# Patient Record
Sex: Female | Born: 1945 | Race: White | Hispanic: No | Marital: Single | Smoking: Current every day smoker
Health system: Southern US, Community
[De-identification: ages and names within clinical notes are randomized; demographics above are authoritative.]

## PROBLEM LIST (undated history)

## (undated) DIAGNOSIS — IMO0002 Reserved for concepts with insufficient information to code with codable children: Secondary | ICD-10-CM

## (undated) DIAGNOSIS — C569 Malignant neoplasm of unspecified ovary: Secondary | ICD-10-CM

## (undated) DIAGNOSIS — C801 Malignant (primary) neoplasm, unspecified: Secondary | ICD-10-CM

## (undated) DIAGNOSIS — C259 Malignant neoplasm of pancreas, unspecified: Secondary | ICD-10-CM

## (undated) DIAGNOSIS — M1990 Unspecified osteoarthritis, unspecified site: Secondary | ICD-10-CM

## (undated) DIAGNOSIS — J449 Chronic obstructive pulmonary disease, unspecified: Secondary | ICD-10-CM

## (undated) DIAGNOSIS — M329 Systemic lupus erythematosus, unspecified: Secondary | ICD-10-CM

## (undated) DIAGNOSIS — B958 Unspecified staphylococcus as the cause of diseases classified elsewhere: Secondary | ICD-10-CM

## (undated) HISTORY — PX: KNEE SURGERY: SHX244

## (undated) HISTORY — PX: GASTRIC BYPASS: SHX52

## (undated) HISTORY — PX: HAND SURGERY: SHX662

## (undated) HISTORY — PX: TONSILLECTOMY: SUR1361

## (undated) HISTORY — DX: Unspecified osteoarthritis, unspecified site: M19.90

## (undated) HISTORY — PX: TUBAL LIGATION: SHX77

## (undated) HISTORY — DX: Malignant (primary) neoplasm, unspecified: C80.1

## (undated) HISTORY — PX: VENTRAL HERNIA REPAIR: SHX424

## (undated) HISTORY — DX: Reserved for concepts with insufficient information to code with codable children: IMO0002

## (undated) HISTORY — PX: PARTIAL HYSTERECTOMY: SHX80

## (undated) HISTORY — DX: Malignant neoplasm of unspecified ovary: C56.9

## (undated) HISTORY — DX: Unspecified staphylococcus as the cause of diseases classified elsewhere: B95.8

## (undated) HISTORY — PX: APPENDECTOMY: SHX54

## (undated) HISTORY — DX: Systemic lupus erythematosus, unspecified: M32.9

## (undated) SURGERY — Surgical Case
Anesthesia: *Unknown

---

## 2002-02-14 ENCOUNTER — Inpatient Hospital Stay (HOSPITAL_COMMUNITY): Admission: EM | Admit: 2002-02-14 | Discharge: 2002-02-27 | Payer: Self-pay | Admitting: Psychiatry

## 2005-02-19 ENCOUNTER — Emergency Department: Payer: Self-pay | Admitting: Emergency Medicine

## 2006-08-06 ENCOUNTER — Emergency Department: Payer: Self-pay | Admitting: Internal Medicine

## 2006-09-27 ENCOUNTER — Emergency Department: Payer: Self-pay | Admitting: Emergency Medicine

## 2006-12-12 ENCOUNTER — Emergency Department: Payer: Self-pay | Admitting: Emergency Medicine

## 2006-12-21 ENCOUNTER — Emergency Department: Payer: Self-pay | Admitting: Emergency Medicine

## 2007-04-17 ENCOUNTER — Emergency Department: Payer: Self-pay | Admitting: Emergency Medicine

## 2007-07-13 ENCOUNTER — Ambulatory Visit: Payer: Self-pay | Admitting: General Surgery

## 2008-08-26 ENCOUNTER — Ambulatory Visit: Payer: Self-pay | Admitting: Specialist

## 2008-09-04 ENCOUNTER — Ambulatory Visit: Payer: Self-pay | Admitting: Specialist

## 2009-05-09 DIAGNOSIS — C569 Malignant neoplasm of unspecified ovary: Secondary | ICD-10-CM

## 2009-05-09 HISTORY — DX: Malignant neoplasm of unspecified ovary: C56.9

## 2009-06-23 ENCOUNTER — Ambulatory Visit: Payer: Self-pay | Admitting: Family Medicine

## 2009-08-07 ENCOUNTER — Ambulatory Visit: Payer: Self-pay | Admitting: Oncology

## 2009-08-11 ENCOUNTER — Ambulatory Visit: Payer: Self-pay | Admitting: Family Medicine

## 2009-08-18 ENCOUNTER — Ambulatory Visit: Payer: Self-pay | Admitting: Family Medicine

## 2009-08-26 ENCOUNTER — Ambulatory Visit: Payer: Self-pay | Admitting: Oncology

## 2009-09-02 ENCOUNTER — Ambulatory Visit: Payer: Self-pay | Admitting: Gastroenterology

## 2009-09-06 ENCOUNTER — Ambulatory Visit: Payer: Self-pay | Admitting: Oncology

## 2009-11-06 ENCOUNTER — Ambulatory Visit: Payer: Self-pay | Admitting: Oncology

## 2009-12-01 ENCOUNTER — Ambulatory Visit: Payer: Self-pay | Admitting: Oncology

## 2009-12-03 ENCOUNTER — Ambulatory Visit: Payer: Self-pay | Admitting: Oncology

## 2009-12-04 LAB — CA 125: CA 125: 21.1 U/mL (ref 0.0–34.0)

## 2009-12-07 ENCOUNTER — Ambulatory Visit: Payer: Self-pay | Admitting: Oncology

## 2009-12-14 ENCOUNTER — Ambulatory Visit: Payer: Self-pay | Admitting: Oncology

## 2009-12-16 ENCOUNTER — Ambulatory Visit: Payer: Self-pay | Admitting: Oncology

## 2009-12-30 ENCOUNTER — Ambulatory Visit: Payer: Self-pay | Admitting: Gynecologic Oncology

## 2010-01-07 ENCOUNTER — Ambulatory Visit: Payer: Self-pay | Admitting: Oncology

## 2010-02-06 ENCOUNTER — Ambulatory Visit: Payer: Self-pay | Admitting: Oncology

## 2010-03-09 ENCOUNTER — Ambulatory Visit: Payer: Self-pay | Admitting: Oncology

## 2010-03-09 ENCOUNTER — Ambulatory Visit: Payer: Self-pay | Admitting: Gynecologic Oncology

## 2010-03-09 HISTORY — PX: LAPAROSCOPIC LYSIS INTESTINAL ADHESIONS: SUR778

## 2010-03-11 LAB — PATHOLOGY REPORT

## 2010-03-16 ENCOUNTER — Inpatient Hospital Stay: Payer: Self-pay | Admitting: General Surgery

## 2010-03-19 ENCOUNTER — Ambulatory Visit: Payer: Self-pay | Admitting: Oncology

## 2010-04-03 ENCOUNTER — Emergency Department: Payer: Self-pay | Admitting: Emergency Medicine

## 2010-04-04 ENCOUNTER — Emergency Department: Payer: Self-pay | Admitting: Emergency Medicine

## 2010-04-08 ENCOUNTER — Ambulatory Visit: Payer: Self-pay | Admitting: Oncology

## 2010-04-20 ENCOUNTER — Ambulatory Visit: Payer: Self-pay | Admitting: Oncology

## 2010-05-09 ENCOUNTER — Ambulatory Visit: Payer: Self-pay | Admitting: Oncology

## 2010-08-22 ENCOUNTER — Emergency Department: Payer: Self-pay | Admitting: Emergency Medicine

## 2010-09-02 ENCOUNTER — Ambulatory Visit: Payer: Self-pay | Admitting: General Surgery

## 2010-09-07 ENCOUNTER — Ambulatory Visit: Payer: Self-pay | Admitting: General Surgery

## 2010-09-07 HISTORY — PX: OTHER SURGICAL HISTORY: SHX169

## 2010-09-16 ENCOUNTER — Inpatient Hospital Stay: Payer: Self-pay | Admitting: General Surgery

## 2010-09-17 HISTORY — PX: INCISION AND DRAINAGE: SHX5863

## 2010-11-23 ENCOUNTER — Ambulatory Visit: Payer: Self-pay | Admitting: General Surgery

## 2010-11-24 ENCOUNTER — Ambulatory Visit: Payer: Self-pay | Admitting: General Surgery

## 2011-01-15 ENCOUNTER — Emergency Department: Payer: Self-pay | Admitting: Emergency Medicine

## 2011-01-20 ENCOUNTER — Emergency Department: Payer: Self-pay | Admitting: Emergency Medicine

## 2011-01-24 ENCOUNTER — Emergency Department: Payer: Self-pay | Admitting: *Deleted

## 2011-02-04 ENCOUNTER — Ambulatory Visit: Payer: Self-pay | Admitting: General Surgery

## 2011-02-09 ENCOUNTER — Ambulatory Visit: Payer: Self-pay | Admitting: General Surgery

## 2011-02-09 DIAGNOSIS — J449 Chronic obstructive pulmonary disease, unspecified: Secondary | ICD-10-CM

## 2011-02-09 HISTORY — PX: UMBILICAL HERNIA REPAIR: SHX196

## 2011-03-20 ENCOUNTER — Emergency Department: Payer: Self-pay | Admitting: Emergency Medicine

## 2011-05-23 ENCOUNTER — Ambulatory Visit: Payer: Self-pay | Admitting: Rheumatology

## 2011-06-21 ENCOUNTER — Ambulatory Visit: Payer: Self-pay | Admitting: Emergency Medicine

## 2011-06-21 LAB — COMPREHENSIVE METABOLIC PANEL
Alkaline Phosphatase: 319 U/L — ABNORMAL HIGH (ref 50–136)
Anion Gap: 10 (ref 7–16)
Bilirubin,Total: 0.5 mg/dL (ref 0.2–1.0)
Calcium, Total: 9 mg/dL (ref 8.5–10.1)
Chloride: 105 mmol/L (ref 98–107)
Co2: 27 mmol/L (ref 21–32)
Creatinine: 0.7 mg/dL (ref 0.60–1.30)
EGFR (Non-African Amer.): >60
Osmolality: 284 (ref 275–301)
Potassium: 3.5 mmol/L (ref 3.5–5.1)
Sodium: 142 mmol/L (ref 136–145)
Total Protein: 7.3 g/dL (ref 6.4–8.2)

## 2011-06-21 LAB — CBC WITH DIFFERENTIAL/PLATELET
Basophil #: 0 10*3/uL (ref 0.0–0.1)
Eosinophil #: 0 10*3/uL (ref 0.0–0.7)
HCT: 38.1 % (ref 35.0–47.0)
Lymphocyte #: 1.1 10*3/uL (ref 1.0–3.6)
MCV: 91 fL (ref 80–100)
Monocyte #: 0.2 10*3/uL (ref 0.0–0.7)
Monocyte %: 6 %
Neutrophil %: 64.6 %
RDW: 12.9 % (ref 11.5–14.5)
WBC: 4 10*3/uL (ref 3.6–11.0)

## 2011-06-23 ENCOUNTER — Ambulatory Visit: Payer: Self-pay | Admitting: Emergency Medicine

## 2011-07-26 ENCOUNTER — Ambulatory Visit: Payer: Self-pay | Admitting: Orthopedic Surgery

## 2011-08-16 ENCOUNTER — Ambulatory Visit: Payer: Self-pay | Admitting: Orthopedic Surgery

## 2011-09-16 ENCOUNTER — Ambulatory Visit: Payer: Self-pay | Admitting: Orthopedic Surgery

## 2011-09-16 LAB — PROTIME-INR: INR: 0.9

## 2011-09-16 LAB — CBC WITH DIFFERENTIAL/PLATELET
Basophil #: 0 10*3/uL (ref 0.0–0.1)
Eosinophil #: 0.1 10*3/uL (ref 0.0–0.7)
Eosinophil %: 1.7 %
HGB: 13.2 g/dL (ref 12.0–16.0)
Lymphocyte #: 1 10*3/uL (ref 1.0–3.6)
Lymphocyte %: 25.1 %
MCHC: 34 g/dL (ref 32.0–36.0)
MCV: 90 fL (ref 80–100)
Neutrophil %: 67.5 %
Platelet: 127 10*3/uL — ABNORMAL LOW (ref 150–440)
RBC: 4.32 10*6/uL (ref 3.80–5.20)
RDW: 13.9 % (ref 11.5–14.5)
WBC: 4 10*3/uL (ref 3.6–11.0)

## 2011-09-16 LAB — HEPATIC FUNCTION PANEL A (ARMC)
Albumin: 3.2 g/dL — ABNORMAL LOW (ref 3.4–5.0)
Alkaline Phosphatase: 413 U/L — ABNORMAL HIGH (ref 50–136)
Bilirubin, Direct: 0.1 mg/dL (ref 0.00–0.20)
Total Protein: 6.9 g/dL (ref 6.4–8.2)

## 2011-09-16 LAB — APTT: Activated PTT: 46.2 secs — ABNORMAL HIGH (ref 23.6–35.9)

## 2011-09-16 LAB — BASIC METABOLIC PANEL
Anion Gap: 7 (ref 7–16)
Co2: 26 mmol/L (ref 21–32)
EGFR (African American): >60
Glucose: 82 mg/dL (ref 65–99)
Potassium: 3.7 mmol/L (ref 3.5–5.1)
Sodium: 142 mmol/L (ref 136–145)

## 2011-09-16 LAB — MRSA PCR SCREENING

## 2011-09-22 ENCOUNTER — Inpatient Hospital Stay: Payer: Self-pay | Admitting: Orthopedic Surgery

## 2011-09-23 LAB — BASIC METABOLIC PANEL
BUN: 15 mg/dL (ref 7–18)
Chloride: 103 mmol/L (ref 98–107)
Co2: 24 mmol/L (ref 21–32)
Creatinine: 0.61 mg/dL (ref 0.60–1.30)
EGFR (African American): >60
Glucose: 124 mg/dL — ABNORMAL HIGH (ref 65–99)
Osmolality: 272 (ref 275–301)
Potassium: 4.1 mmol/L (ref 3.5–5.1)
Sodium: 135 mmol/L — ABNORMAL LOW (ref 136–145)

## 2011-09-23 LAB — HEMOGLOBIN: HGB: 11.9 g/dL — ABNORMAL LOW (ref 12.0–16.0)

## 2011-09-23 LAB — PLATELET COUNT: Platelet: 106 10*3/uL — ABNORMAL LOW (ref 150–440)

## 2011-09-24 LAB — URINALYSIS, COMPLETE
Ketone: NEGATIVE
Nitrite: POSITIVE
Ph: 7 (ref 4.5–8.0)
WBC UR: 15 /HPF (ref 0–5)

## 2011-09-30 LAB — PATHOLOGY REPORT

## 2011-10-29 ENCOUNTER — Emergency Department: Payer: Self-pay | Admitting: Internal Medicine

## 2011-11-20 ENCOUNTER — Emergency Department: Payer: Self-pay | Admitting: Emergency Medicine

## 2011-11-24 ENCOUNTER — Emergency Department: Payer: Self-pay | Admitting: Emergency Medicine

## 2012-01-16 ENCOUNTER — Ambulatory Visit: Payer: Self-pay | Admitting: Emergency Medicine

## 2012-01-16 LAB — CBC WITH DIFFERENTIAL/PLATELET
Basophil %: 0.5 %
Eosinophil %: 1.7 %
HCT: 38.2 % (ref 35.0–47.0)
Lymphocyte #: 1.6 10*3/uL (ref 1.0–3.6)
Lymphocyte %: 27.5 %
MCH: 30.2 pg (ref 26.0–34.0)
MCV: 88 fL (ref 80–100)
Monocyte #: 0.5 x10 3/mm (ref 0.2–0.9)
Monocyte %: 8 %
Platelet: 178 10*3/uL (ref 150–440)
WBC: 5.9 10*3/uL (ref 3.6–11.0)

## 2012-01-16 LAB — COMPREHENSIVE METABOLIC PANEL
Albumin: 3.4 g/dL (ref 3.4–5.0)
Alkaline Phosphatase: 556 U/L — ABNORMAL HIGH (ref 50–136)
Bilirubin,Total: 0.4 mg/dL (ref 0.2–1.0)
Calcium, Total: 9.2 mg/dL (ref 8.5–10.1)
Chloride: 108 mmol/L — ABNORMAL HIGH (ref 98–107)
Creatinine: 0.71 mg/dL (ref 0.60–1.30)
EGFR (African American): >60
EGFR (Non-African Amer.): >60
Glucose: 86 mg/dL (ref 65–99)
Osmolality: 279 (ref 275–301)
Potassium: 3.7 mmol/L (ref 3.5–5.1)
Sodium: 140 mmol/L (ref 136–145)

## 2012-01-17 ENCOUNTER — Ambulatory Visit: Payer: Self-pay | Admitting: Emergency Medicine

## 2012-05-09 HISTORY — PX: HERNIA REPAIR: SHX51

## 2012-06-15 ENCOUNTER — Other Ambulatory Visit: Payer: Self-pay | Admitting: Bariatrics

## 2012-07-21 ENCOUNTER — Emergency Department: Payer: Self-pay | Admitting: Unknown Physician Specialty

## 2012-07-21 LAB — DIFFERENTIAL
Basophil %: 1 %
Lymphocyte %: 19.6 %
Monocyte #: 0.2 x10 3/mm (ref 0.2–0.9)

## 2012-07-21 LAB — COMPREHENSIVE METABOLIC PANEL
Albumin: 2.8 g/dL — ABNORMAL LOW (ref 3.4–5.0)
Alkaline Phosphatase: 350 U/L — ABNORMAL HIGH (ref 50–136)
Anion Gap: 6 — ABNORMAL LOW (ref 7–16)
BUN: 12 mg/dL (ref 7–18)
Bilirubin,Total: 0.4 mg/dL (ref 0.2–1.0)
Co2: 24 mmol/L (ref 21–32)
Creatinine: 0.68 mg/dL (ref 0.60–1.30)
EGFR (African American): >60
Potassium: 3.7 mmol/L (ref 3.5–5.1)
SGOT(AST): 21 U/L (ref 15–37)
Sodium: 141 mmol/L (ref 136–145)
Total Protein: 6.4 g/dL (ref 6.4–8.2)

## 2012-07-21 LAB — TROPONIN I: Troponin-I: <0.02 ng/mL

## 2012-07-21 LAB — CBC
MCH: 30.1 pg (ref 26.0–34.0)
MCHC: 33.5 g/dL (ref 32.0–36.0)
RDW: 13.8 % (ref 11.5–14.5)
WBC: 2.9 10*3/uL — ABNORMAL LOW (ref 3.6–11.0)

## 2012-07-21 LAB — APTT: Activated PTT: 71.3 secs — ABNORMAL HIGH (ref 23.6–35.9)

## 2012-07-21 LAB — LIPASE, BLOOD: Lipase: 103 U/L (ref 73–393)

## 2012-07-21 LAB — PROTIME-INR: Prothrombin Time: 13.9 secs (ref 11.5–14.7)

## 2012-08-16 ENCOUNTER — Emergency Department: Payer: Self-pay | Admitting: Emergency Medicine

## 2012-08-16 LAB — URINALYSIS, COMPLETE
Blood: NEGATIVE
Glucose,UR: NEGATIVE mg/dL (ref 0–75)
Ph: 7 (ref 4.5–8.0)
RBC,UR: <1 /HPF (ref 0–5)
Squamous Epithelial: 1
WBC UR: 2 /HPF (ref 0–5)

## 2012-08-16 LAB — COMPREHENSIVE METABOLIC PANEL
BUN: 17 mg/dL (ref 7–18)
Bilirubin,Total: 0.5 mg/dL (ref 0.2–1.0)
Calcium, Total: 9 mg/dL (ref 8.5–10.1)
Chloride: 107 mmol/L (ref 98–107)
Creatinine: 0.8 mg/dL (ref 0.60–1.30)
EGFR (African American): >60
EGFR (Non-African Amer.): >60
Glucose: 114 mg/dL — ABNORMAL HIGH (ref 65–99)
Osmolality: 278 (ref 275–301)
Potassium: 4.6 mmol/L (ref 3.5–5.1)
SGOT(AST): 30 U/L (ref 15–37)
Sodium: 138 mmol/L (ref 136–145)

## 2012-08-16 LAB — CBC
HCT: 40.2 % (ref 35.0–47.0)
MCV: 88 fL (ref 80–100)
RBC: 4.59 10*6/uL (ref 3.80–5.20)
WBC: 5.7 10*3/uL (ref 3.6–11.0)

## 2012-10-05 ENCOUNTER — Ambulatory Visit: Payer: Self-pay | Admitting: Bariatrics

## 2013-01-08 ENCOUNTER — Ambulatory Visit: Payer: Self-pay | Admitting: Pain Medicine

## 2013-01-30 ENCOUNTER — Ambulatory Visit: Payer: Self-pay | Admitting: Orthopedic Surgery

## 2013-01-30 LAB — CBC
HCT: 39.7 % (ref 35.0–47.0)
HGB: 13.7 g/dL (ref 12.0–16.0)
MCH: 30.4 pg (ref 26.0–34.0)
MCHC: 34.6 g/dL (ref 32.0–36.0)
MCV: 88 fL (ref 80–100)
Platelet: 114 10*3/uL — ABNORMAL LOW (ref 150–440)
RBC: 4.52 10*6/uL (ref 3.80–5.20)
RDW: 14.3 % (ref 11.5–14.5)

## 2013-01-30 LAB — URINALYSIS, COMPLETE
Bacteria: NONE SEEN
Bilirubin,UR: NEGATIVE
Glucose,UR: NEGATIVE mg/dL (ref 0–75)
Ketone: NEGATIVE
Ph: 7 (ref 4.5–8.0)
Protein: NEGATIVE
RBC,UR: 1 /HPF (ref 0–5)
WBC UR: 4 /HPF (ref 0–5)

## 2013-01-30 LAB — MRSA PCR SCREENING

## 2013-01-30 LAB — BASIC METABOLIC PANEL
Calcium, Total: 9.2 mg/dL (ref 8.5–10.1)
Chloride: 105 mmol/L (ref 98–107)
Creatinine: 0.68 mg/dL (ref 0.60–1.30)
EGFR (African American): >60
Potassium: 4.1 mmol/L (ref 3.5–5.1)
Sodium: 136 mmol/L (ref 136–145)

## 2013-01-30 LAB — SEDIMENTATION RATE: Erythrocyte Sed Rate: 37 mm/hr — ABNORMAL HIGH (ref 0–30)

## 2013-02-12 ENCOUNTER — Inpatient Hospital Stay: Payer: Self-pay | Admitting: Orthopedic Surgery

## 2013-02-13 LAB — BASIC METABOLIC PANEL
Calcium, Total: 8.7 mg/dL (ref 8.5–10.1)
Co2: 24 mmol/L (ref 21–32)
Glucose: 109 mg/dL — ABNORMAL HIGH (ref 65–99)
Osmolality: 272 (ref 275–301)
Sodium: 135 mmol/L — ABNORMAL LOW (ref 136–145)

## 2013-02-13 LAB — PLATELET COUNT: Platelet: 102 10*3/uL — ABNORMAL LOW (ref 150–440)

## 2013-02-14 LAB — PLATELET COUNT: Platelet: 95 10*3/uL — ABNORMAL LOW (ref 150–440)

## 2013-05-08 ENCOUNTER — Ambulatory Visit: Payer: Self-pay | Admitting: Ophthalmology

## 2013-05-29 ENCOUNTER — Ambulatory Visit: Payer: Self-pay | Admitting: Ophthalmology

## 2013-07-10 ENCOUNTER — Ambulatory Visit: Payer: Self-pay | Admitting: Anesthesiology

## 2013-07-25 ENCOUNTER — Ambulatory Visit: Payer: Self-pay | Admitting: Orthopedic Surgery

## 2013-07-25 LAB — CBC
HCT: 37.2 % (ref 35.0–47.0)
HGB: 12.6 g/dL (ref 12.0–16.0)
MCH: 29.9 pg (ref 26.0–34.0)
MCHC: 33.8 g/dL (ref 32.0–36.0)
MCV: 89 fL (ref 80–100)
PLATELETS: 122 10*3/uL — AB (ref 150–440)
RBC: 4.2 10*6/uL (ref 3.80–5.20)
RDW: 14.2 % (ref 11.5–14.5)
WBC: 4.1 10*3/uL (ref 3.6–11.0)

## 2013-07-25 LAB — BASIC METABOLIC PANEL
Anion Gap: 6 — ABNORMAL LOW (ref 7–16)
BUN: 13 mg/dL (ref 7–18)
CO2: 26 mmol/L (ref 21–32)
Calcium, Total: 9.2 mg/dL (ref 8.5–10.1)
Chloride: 105 mmol/L (ref 98–107)
Creatinine: 0.72 mg/dL (ref 0.60–1.30)
EGFR (African American): >60
GLUCOSE: 82 mg/dL (ref 65–99)
OSMOLALITY: 273 (ref 275–301)
Potassium: 4.2 mmol/L (ref 3.5–5.1)
Sodium: 137 mmol/L (ref 136–145)

## 2013-08-01 ENCOUNTER — Ambulatory Visit: Payer: Self-pay | Admitting: Orthopedic Surgery

## 2013-08-28 ENCOUNTER — Emergency Department: Payer: Self-pay

## 2013-08-28 LAB — CBC WITH DIFFERENTIAL/PLATELET
BASOS ABS: 0 10*3/uL (ref 0.0–0.1)
Basophil %: 0.2 %
EOS ABS: 0 10*3/uL (ref 0.0–0.7)
Eosinophil %: 0.4 %
HCT: 36.5 % (ref 35.0–47.0)
HGB: 12.1 g/dL (ref 12.0–16.0)
LYMPHS ABS: 0.7 10*3/uL — AB (ref 1.0–3.6)
LYMPHS PCT: 9.7 %
MCH: 29.3 pg (ref 26.0–34.0)
MCHC: 33.2 g/dL (ref 32.0–36.0)
MCV: 88 fL (ref 80–100)
Monocyte #: 0.4 x10 3/mm (ref 0.2–0.9)
Monocyte %: 5.5 %
NEUTROS PCT: 84.2 %
Neutrophil #: 5.7 10*3/uL (ref 1.4–6.5)
Platelet: 100 10*3/uL — ABNORMAL LOW (ref 150–440)
RBC: 4.14 10*6/uL (ref 3.80–5.20)
RDW: 14.6 % — ABNORMAL HIGH (ref 11.5–14.5)
WBC: 6.8 10*3/uL (ref 3.6–11.0)

## 2013-08-28 LAB — COMPREHENSIVE METABOLIC PANEL
ALT: 44 U/L (ref 12–78)
Albumin: 3.2 g/dL — ABNORMAL LOW (ref 3.4–5.0)
Alkaline Phosphatase: 542 U/L — ABNORMAL HIGH
Anion Gap: 7 (ref 7–16)
BUN: 14 mg/dL (ref 7–18)
Bilirubin,Total: 0.6 mg/dL (ref 0.2–1.0)
Calcium, Total: 8.5 mg/dL (ref 8.5–10.1)
Chloride: 106 mmol/L (ref 98–107)
Co2: 22 mmol/L (ref 21–32)
Creatinine: 0.77 mg/dL (ref 0.60–1.30)
EGFR (African American): >60
Glucose: 160 mg/dL — ABNORMAL HIGH (ref 65–99)
Osmolality: 274 (ref 275–301)
Potassium: 4 mmol/L (ref 3.5–5.1)
SGOT(AST): 45 U/L — ABNORMAL HIGH (ref 15–37)
Sodium: 135 mmol/L — ABNORMAL LOW (ref 136–145)
TOTAL PROTEIN: 7.4 g/dL (ref 6.4–8.2)

## 2013-08-28 LAB — TROPONIN I

## 2013-08-28 LAB — LIPASE, BLOOD: Lipase: 85 U/L (ref 73–393)

## 2013-09-02 ENCOUNTER — Ambulatory Visit: Payer: Self-pay | Admitting: Bariatrics

## 2013-10-31 ENCOUNTER — Ambulatory Visit: Payer: Self-pay | Admitting: Family Medicine

## 2013-11-01 ENCOUNTER — Ambulatory Visit: Payer: Self-pay | Admitting: Family Medicine

## 2013-11-19 ENCOUNTER — Other Ambulatory Visit: Payer: Self-pay | Admitting: Urgent Care

## 2013-11-19 LAB — IRON: Iron: 60 ug/dL (ref 50–170)

## 2013-11-19 LAB — HEPATIC FUNCTION PANEL A (ARMC)
ALT: 50 U/L (ref 12–78)
Albumin: 3.5 g/dL (ref 3.4–5.0)
Alkaline Phosphatase: 533 U/L — ABNORMAL HIGH
Bilirubin, Direct: 0.2 mg/dL (ref 0.00–0.20)
Bilirubin,Total: 0.6 mg/dL (ref 0.2–1.0)
SGOT(AST): 32 U/L (ref 15–37)
Total Protein: 8.2 g/dL (ref 6.4–8.2)

## 2013-11-19 LAB — FERRITIN: FERRITIN (ARMC): 29 ng/mL (ref 8–388)

## 2013-11-19 LAB — PROTIME-INR
INR: 1
Prothrombin Time: 12.9 secs (ref 11.5–14.7)

## 2013-11-21 LAB — AFP TUMOR MARKER: AFP TUMOR MARKER: 1.1 ng/mL (ref 0.0–8.3)

## 2013-11-26 ENCOUNTER — Ambulatory Visit: Payer: Self-pay | Admitting: Urgent Care

## 2013-12-11 ENCOUNTER — Ambulatory Visit: Payer: Self-pay | Admitting: Gastroenterology

## 2013-12-11 LAB — KOH PREP

## 2013-12-17 ENCOUNTER — Ambulatory Visit: Payer: Self-pay | Admitting: Family Medicine

## 2014-01-03 DIAGNOSIS — J449 Chronic obstructive pulmonary disease, unspecified: Secondary | ICD-10-CM | POA: Insufficient documentation

## 2014-01-03 DIAGNOSIS — I6529 Occlusion and stenosis of unspecified carotid artery: Secondary | ICD-10-CM | POA: Insufficient documentation

## 2014-01-03 DIAGNOSIS — E669 Obesity, unspecified: Secondary | ICD-10-CM | POA: Insufficient documentation

## 2014-01-03 DIAGNOSIS — G4700 Insomnia, unspecified: Secondary | ICD-10-CM | POA: Insufficient documentation

## 2014-01-03 DIAGNOSIS — F419 Anxiety disorder, unspecified: Secondary | ICD-10-CM | POA: Insufficient documentation

## 2014-01-03 DIAGNOSIS — C801 Malignant (primary) neoplasm, unspecified: Secondary | ICD-10-CM | POA: Insufficient documentation

## 2014-01-03 DIAGNOSIS — K219 Gastro-esophageal reflux disease without esophagitis: Secondary | ICD-10-CM | POA: Insufficient documentation

## 2014-01-03 DIAGNOSIS — E010 Iodine-deficiency related diffuse (endemic) goiter: Secondary | ICD-10-CM | POA: Insufficient documentation

## 2014-01-03 DIAGNOSIS — F1120 Opioid dependence, uncomplicated: Secondary | ICD-10-CM | POA: Insufficient documentation

## 2014-01-03 DIAGNOSIS — Z8489 Family history of other specified conditions: Secondary | ICD-10-CM | POA: Insufficient documentation

## 2014-01-06 DIAGNOSIS — Z87898 Personal history of other specified conditions: Secondary | ICD-10-CM | POA: Insufficient documentation

## 2014-01-06 DIAGNOSIS — R079 Chest pain, unspecified: Secondary | ICD-10-CM | POA: Insufficient documentation

## 2014-01-16 ENCOUNTER — Ambulatory Visit: Payer: Self-pay | Admitting: Gastroenterology

## 2014-01-17 LAB — PATHOLOGY REPORT

## 2014-01-28 DIAGNOSIS — R0681 Apnea, not elsewhere classified: Secondary | ICD-10-CM | POA: Insufficient documentation

## 2014-01-29 ENCOUNTER — Ambulatory Visit: Payer: Self-pay | Admitting: Specialist

## 2014-02-25 ENCOUNTER — Emergency Department: Payer: Self-pay | Admitting: Emergency Medicine

## 2014-02-25 LAB — CBC WITH DIFFERENTIAL/PLATELET
Basophil #: 0 10*3/uL (ref 0.0–0.1)
Basophil %: 0.3 %
EOS ABS: 0.1 10*3/uL (ref 0.0–0.7)
EOS PCT: 0.9 %
HCT: 37.6 % (ref 35.0–47.0)
HGB: 12.3 g/dL (ref 12.0–16.0)
LYMPHS ABS: 1 10*3/uL (ref 1.0–3.6)
LYMPHS PCT: 18.1 %
MCH: 28.6 pg (ref 26.0–34.0)
MCHC: 32.7 g/dL (ref 32.0–36.0)
MCV: 88 fL (ref 80–100)
Monocyte #: 0.4 x10 3/mm (ref 0.2–0.9)
Monocyte %: 7.1 %
NEUTROS ABS: 4.2 10*3/uL (ref 1.4–6.5)
Neutrophil %: 73.6 %
PLATELETS: 108 10*3/uL — AB (ref 150–440)
RBC: 4.29 10*6/uL (ref 3.80–5.20)
RDW: 14.4 % (ref 11.5–14.5)
WBC: 5.7 10*3/uL (ref 3.6–11.0)

## 2014-02-25 LAB — COMPREHENSIVE METABOLIC PANEL
ALBUMIN: 3.2 g/dL — AB (ref 3.4–5.0)
ALK PHOS: 450 U/L — AB
AST: 30 U/L (ref 15–37)
Anion Gap: 8 (ref 7–16)
BUN: 9 mg/dL (ref 7–18)
Bilirubin,Total: 0.5 mg/dL (ref 0.2–1.0)
CALCIUM: 8.6 mg/dL (ref 8.5–10.1)
CO2: 24 mmol/L (ref 21–32)
Chloride: 106 mmol/L (ref 98–107)
Creatinine: 0.64 mg/dL (ref 0.60–1.30)
Glucose: 129 mg/dL — ABNORMAL HIGH (ref 65–99)
Osmolality: 276 (ref 275–301)
Potassium: 4 mmol/L (ref 3.5–5.1)
SGPT (ALT): 40 U/L
SODIUM: 138 mmol/L (ref 136–145)
TOTAL PROTEIN: 7.2 g/dL (ref 6.4–8.2)

## 2014-02-25 LAB — URINALYSIS, COMPLETE
BILIRUBIN, UR: NEGATIVE
Bacteria: NONE SEEN
Blood: NEGATIVE
Glucose,UR: NEGATIVE mg/dL (ref 0–75)
Ketone: NEGATIVE
Leukocyte Esterase: NEGATIVE
NITRITE: NEGATIVE
PH: 5 (ref 4.5–8.0)
Protein: NEGATIVE
RBC,UR: NONE SEEN /HPF (ref 0–5)
SPECIFIC GRAVITY: 1.005 (ref 1.003–1.030)

## 2014-02-25 LAB — PROTIME-INR
INR: 1.1
Prothrombin Time: 13.8 secs (ref 11.5–14.7)

## 2014-05-09 DIAGNOSIS — C801 Malignant (primary) neoplasm, unspecified: Secondary | ICD-10-CM

## 2014-05-09 HISTORY — DX: Malignant (primary) neoplasm, unspecified: C80.1

## 2014-05-14 ENCOUNTER — Ambulatory Visit: Payer: Self-pay | Admitting: Urgent Care

## 2014-05-14 LAB — CBC WITH DIFFERENTIAL/PLATELET
Basophil #: 0 10*3/uL (ref 0.0–0.1)
Basophil %: 0.3 %
EOS ABS: 0.1 10*3/uL (ref 0.0–0.7)
Eosinophil %: 0.9 %
HCT: 41.4 % (ref 35.0–47.0)
HGB: 13.9 g/dL (ref 12.0–16.0)
Lymphocyte #: 1.4 10*3/uL (ref 1.0–3.6)
Lymphocyte %: 21.1 %
MCH: 29.7 pg (ref 26.0–34.0)
MCHC: 33.5 g/dL (ref 32.0–36.0)
MCV: 89 fL (ref 80–100)
Monocyte #: 0.4 x10 3/mm (ref 0.2–0.9)
Monocyte %: 6.3 %
NEUTROS ABS: 4.7 10*3/uL (ref 1.4–6.5)
Neutrophil %: 71.4 %
PLATELETS: 126 10*3/uL — AB (ref 150–440)
RBC: 4.66 10*6/uL (ref 3.80–5.20)
RDW: 15 % — AB (ref 11.5–14.5)
WBC: 6.7 10*3/uL (ref 3.6–11.0)

## 2014-05-14 LAB — HEPATIC FUNCTION PANEL A (ARMC)
ALBUMIN: 3.3 g/dL — AB (ref 3.4–5.0)
ALT: 46 U/L
AST: 38 U/L — AB (ref 15–37)
Alkaline Phosphatase: 340 U/L — ABNORMAL HIGH
Bilirubin, Direct: 0.2 mg/dL (ref 0.0–0.2)
Bilirubin,Total: 0.6 mg/dL (ref 0.2–1.0)
TOTAL PROTEIN: 7.4 g/dL (ref 6.4–8.2)

## 2014-05-14 LAB — BASIC METABOLIC PANEL
ANION GAP: 6 — AB (ref 7–16)
BUN: 9 mg/dL (ref 7–18)
CALCIUM: 8.7 mg/dL (ref 8.5–10.1)
Chloride: 106 mmol/L (ref 98–107)
Co2: 26 mmol/L (ref 21–32)
Creatinine: 0.84 mg/dL (ref 0.60–1.30)
EGFR (African American): >60
GLUCOSE: 116 mg/dL — AB (ref 65–99)
Osmolality: 275 (ref 275–301)
Potassium: 4 mmol/L (ref 3.5–5.1)
SODIUM: 138 mmol/L (ref 136–145)

## 2014-05-14 LAB — PROTIME-INR
INR: 1.1
Prothrombin Time: 14.3 secs (ref 11.5–14.7)

## 2014-05-19 ENCOUNTER — Ambulatory Visit: Payer: Self-pay | Admitting: Urgent Care

## 2014-05-29 ENCOUNTER — Ambulatory Visit: Payer: Self-pay | Admitting: Urgent Care

## 2014-05-30 LAB — AFP TUMOR MARKER: AFP TUMOR MARKER: 1.5 ng/mL (ref 0.0–8.3)

## 2014-06-10 ENCOUNTER — Ambulatory Visit: Payer: Self-pay | Admitting: Bariatrics

## 2014-06-10 LAB — COMPREHENSIVE METABOLIC PANEL
ALBUMIN: 3.3 g/dL — AB (ref 3.4–5.0)
ANION GAP: 10 (ref 7–16)
Alkaline Phosphatase: 335 U/L — ABNORMAL HIGH (ref 46–116)
BILIRUBIN TOTAL: 0.8 mg/dL (ref 0.2–1.0)
BUN: 13 mg/dL (ref 7–18)
Calcium, Total: 9.4 mg/dL (ref 8.5–10.1)
Chloride: 102 mmol/L (ref 98–107)
Co2: 26 mmol/L (ref 21–32)
Creatinine: 0.78 mg/dL (ref 0.60–1.30)
EGFR (African American): >60
EGFR (Non-African Amer.): >60
GLUCOSE: 132 mg/dL — AB (ref 65–99)
OSMOLALITY: 278 (ref 275–301)
POTASSIUM: 4 mmol/L (ref 3.5–5.1)
SGOT(AST): 36 U/L (ref 15–37)
SGPT (ALT): 54 U/L (ref 14–63)
SODIUM: 138 mmol/L (ref 136–145)
Total Protein: 7.2 g/dL (ref 6.4–8.2)

## 2014-06-17 ENCOUNTER — Ambulatory Visit: Payer: Self-pay | Admitting: Oncology

## 2014-06-19 DIAGNOSIS — R413 Other amnesia: Secondary | ICD-10-CM | POA: Insufficient documentation

## 2014-06-19 DIAGNOSIS — F09 Unspecified mental disorder due to known physiological condition: Secondary | ICD-10-CM | POA: Insufficient documentation

## 2014-06-23 ENCOUNTER — Ambulatory Visit: Payer: Self-pay | Admitting: Oncology

## 2014-06-25 ENCOUNTER — Ambulatory Visit: Payer: Self-pay | Admitting: Oncology

## 2014-07-03 ENCOUNTER — Ambulatory Visit: Payer: Self-pay | Admitting: Internal Medicine

## 2014-07-06 ENCOUNTER — Emergency Department: Payer: Self-pay | Admitting: Internal Medicine

## 2014-07-08 ENCOUNTER — Ambulatory Visit
Admit: 2014-07-08 | Disposition: A | Discharge status: Home / Self Care | Payer: Self-pay | Attending: Oncology | Admitting: Oncology

## 2014-07-14 DIAGNOSIS — C251 Malignant neoplasm of body of pancreas: Secondary | ICD-10-CM | POA: Insufficient documentation

## 2014-07-17 ENCOUNTER — Ambulatory Visit (INDEPENDENT_AMBULATORY_CARE_PROVIDER_SITE_OTHER): Payer: Medicare Other | Admitting: General Surgery

## 2014-07-17 ENCOUNTER — Encounter: Payer: Self-pay | Admitting: General Surgery

## 2014-07-17 ENCOUNTER — Other Ambulatory Visit: Payer: Self-pay | Admitting: General Surgery

## 2014-07-17 VITALS — BP 120/84 | HR 80 | Resp 16 | Ht 61.0 in | Wt 191.0 lb

## 2014-07-17 DIAGNOSIS — C259 Malignant neoplasm of pancreas, unspecified: Secondary | ICD-10-CM | POA: Diagnosis not present

## 2014-07-17 NOTE — Patient Instructions (Addendum)

## 2014-07-17 NOTE — Progress Notes (Signed)
Patient ID: Tiffany Gamble, female   DOB: 04/13/1946, 69 y.o.   MRN: [id number]  Chief Complaint  Patient presents with  . Other    port placement    HPI Tiffany Gamble is a 69 y.o. female.  Here today to discuss having a port placed. She was recently diagnosed with pancreatic cancer. The cancer was found by Dr Duke Salvia when he was doing an MRI scan to see if she had another abdominal hernia. She had her first treatment yesterday followed by Dr. Grayland Ormond.   HPI  Past Medical History  Diagnosis Date  . Lupus   . Staph infection     MRSA  . Arthritis   . Cancer 2016    pancreas  . Ovarian cancer 2011    left    Past Surgical History  Procedure Laterality Date  . Tonsillectomy    . Appendectomy    . Hand surgery Bilateral   . Knee surgery Right   . Partial hysterectomy    . Tubal ligation    . Ventral hernia repair  03-17-10, 11-24-10    Dr Bary Castilla  . Umbilical hernia repair  02-09-11    and ventral by Dr Bary Castilla  . Laparoscopic lysis intestinal adhesions  03-09-10    Dr Bary Castilla  . Incision and drainage  09-17-10    with wound vac Dr Bary Castilla  . Abd wall hernia  09-07-10     Dr. Bary Castilla  . Hernia repair  2014    last surgery was Dr. Duke Salvia  . Gastric bypass      Family History  Problem Relation Age of Onset  . Cancer Mother     lymphoma  . Heart attack Father     Social History History  Substance Use Topics  . Smoking status: Current Every Day Smoker -- 0.50 packs/day for 50 years    Types: Cigarettes  . Smokeless tobacco: Not on file  . Alcohol Use: No    Allergies  Allergen Reactions  . Acetaminophen Other (See Comments)    Affects liver.  . Codeine Hives  . Fluticasone Furoate-Vilanterol Other (See Comments)    thrush  . Hydrocodone-Acetaminophen Itching  . Ibuprofen Other (See Comments)    Affects liver.  . Prednisone Other (See Comments)    Throat swelling  . Penicillins Rash    Current Outpatient Prescriptions  Medication Sig Dispense  Refill  . cyclobenzaprine (FLEXERIL) 10 MG tablet Take 10 mg by mouth every 8 (eight) hours.  3  . donepezil (ARICEPT) 5 MG tablet Take 5 mg by mouth at bedtime.   2  . fentaNYL (DURAGESIC - DOSED MCG/HR) 25 MCG/HR patch Place 25 mcg onto the skin every 3 (three) days.     . fluticasone (FLONASE) 50 MCG/ACT nasal spray Place 2 sprays into both nostrils daily.   11  . Multiple Vitamin (MULTI-VITAMINS) TABS Take by mouth daily.     . Oxycodone HCl 10 MG TABS Take 10 mg by mouth every 6 (six) hours as needed.  0  . pantoprazole (PROTONIX) 40 MG tablet Take 40 mg by mouth 2 (two) times daily.   5  . polyethylene glycol powder (GLYCOLAX/MIRALAX) powder Take by mouth 2 (two) times daily.   5  . prochlorperazine (COMPAZINE) 10 MG tablet Take 10 mg by mouth every 8 (eight) hours as needed.     Marland Kitchen SPIRIVA HANDIHALER 18 MCG inhalation capsule Place 18 mcg into inhaler and inhale daily.   3  . ursodiol (ACTIGALL) 300  MG capsule Take by mouth 4 (four) times daily.     . VENTOLIN HFA 108 (90 BASE) MCG/ACT inhaler Inhale 2 puffs into the lungs every 6 (six) hours as needed.   12   No current facility-administered medications for this visit.    Review of Systems Review of Systems  Constitutional: Negative.   Respiratory: Negative.   Cardiovascular: Negative.     Blood pressure 120/84, pulse 80, resp. rate 16, height 5\' 1"  (1.549 m), weight 191 lb (86.637 kg).  Physical Exam Physical Exam  Constitutional: She is oriented to person, place, and time. She appears well-developed and well-nourished.  Neck: Neck supple.  Cardiovascular: Normal rate, regular rhythm and normal heart sounds.   Pulmonary/Chest: Effort normal and breath sounds normal.  Abdominal: Normal appearance.  Lymphadenopathy:    She has no cervical adenopathy.  Neurological: She is alert and oriented to person, place, and time.  Skin: Skin is warm and dry.    Data Reviewed Laboratory studies dated 07/16/2014 showed a hemoglobin of  13.2 with an MCV of 87, white blood cell count of 4200 with 79% polys, 14% lymphocytes, platelet count of 147,000. Nonfasting glucose 192, creatinine 0.7 with an estimated GFR greater than 60. CA 19-9: 356.  Laboratory studies dated 07/06/2014 showed a pro time INR of 1.1.  CT of the abdomen and pelvis dated 06/10/2014 to evaluate for possible recurrent ventral hernia showed evidence of a 3.7 cm hypoechoic mass in the uncinate process with ductal pancreatic duct dilatation and encasement of the superior mesenteric artery. Splenomegaly noted. Hepatic cirrhosis with diffuse nodular contour noted. Extensive adenopathy.  Assessment    Metastatic pancreatic cancer.    Plan    Central venous access has been requested by the treating oncologist. She is on weekly infusions of to immunosuppressive drugs. The referring oncologist was on vacation, and I spoke with his colleague. Patient is artery starting out with mildly low platelet count. We'll arrange to have a CBC completed the morning of the procedure, 6 days after her first infusion, with the hopes the platelet count will be high enough to allow safe placement of a central venous access catheter.  The patient has been advised that if the platelet counts are too low, the procedure may need to be canceled.  If the platelet count is borderline, the internal jugular rather than subclavian vein may be utilized.     The risks associated with central venous access including arterial, pulmonary and venous injury were reviewed. The possible need for additional treatment if pulmonary injury occurs (chest tube placement) was discussed.  Patient's surgery has been scheduled for 07-22-14 at [facility].  PCP:  [name] Ref: Dr. Tonny Bollman, Forest Gleason 07/17/2014, 6:46 PM

## 2014-07-22 ENCOUNTER — Encounter: Payer: Self-pay | Admitting: General Surgery

## 2014-07-22 ENCOUNTER — Ambulatory Visit: Payer: Self-pay | Admitting: General Surgery

## 2014-07-22 DIAGNOSIS — C259 Malignant neoplasm of pancreas, unspecified: Secondary | ICD-10-CM | POA: Diagnosis not present

## 2014-07-23 ENCOUNTER — Encounter: Payer: Self-pay | Admitting: General Surgery

## 2014-07-30 LAB — CREATININE, SERUM: Creatine, Serum: 0.61

## 2014-08-01 LAB — CBC CANCER CENTER
BASOS ABS: 0 x10 3/mm (ref 0.0–0.1)
Basophil %: 0.4 %
EOS ABS: 0 x10 3/mm (ref 0.0–0.7)
Eosinophil %: 1 %
HCT: 35.3 % (ref 35.0–47.0)
HGB: 12.1 g/dL (ref 12.0–16.0)
LYMPHS PCT: 16.5 %
Lymphocyte #: 0.7 x10 3/mm — ABNORMAL LOW (ref 1.0–3.6)
MCH: 29.5 pg (ref 26.0–34.0)
MCHC: 34.2 g/dL (ref 32.0–36.0)
MCV: 86 fL (ref 80–100)
MONO ABS: 0.4 x10 3/mm (ref 0.2–0.9)
Monocyte %: 9 %
NEUTROS PCT: 73.1 %
Neutrophil #: 3.1 x10 3/mm (ref 1.4–6.5)
PLATELETS: 187 x10 3/mm (ref 150–440)
RBC: 4.09 10*6/uL (ref 3.80–5.20)
RDW: 14.6 % — AB (ref 11.5–14.5)
WBC: 4.2 x10 3/mm (ref 3.6–11.0)

## 2014-08-01 LAB — COMPREHENSIVE METABOLIC PANEL
ALBUMIN: 3.2 g/dL — AB
AST: 36 U/L
Alkaline Phosphatase: 312 U/L — ABNORMAL HIGH
Anion Gap: 6 — ABNORMAL LOW (ref 7–16)
BUN: 6 mg/dL
Bilirubin,Total: 0.6 mg/dL
CALCIUM: 8.6 mg/dL — AB
Chloride: 102 mmol/L
Co2: 24 mmol/L
Creatinine: 0.61 mg/dL
EGFR (Non-African Amer.): >60
Glucose: 122 mg/dL — ABNORMAL HIGH
POTASSIUM: 3.8 mmol/L
SGPT (ALT): 33 U/L
SODIUM: 132 mmol/L — AB
Total Protein: 6.6 g/dL

## 2014-08-06 LAB — CBC CANCER CENTER
Basophil #: 0 x10 3/mm (ref 0.0–0.1)
Basophil %: 0.4 %
EOS ABS: 0 x10 3/mm (ref 0.0–0.7)
Eosinophil %: 1 %
HCT: 32.5 % — AB (ref 35.0–47.0)
HGB: 11 g/dL — ABNORMAL LOW (ref 12.0–16.0)
LYMPHS PCT: 24.2 %
Lymphocyte #: 0.6 x10 3/mm — ABNORMAL LOW (ref 1.0–3.6)
MCH: 29.2 pg (ref 26.0–34.0)
MCHC: 33.9 g/dL (ref 32.0–36.0)
MCV: 86 fL (ref 80–100)
MONO ABS: 0.2 x10 3/mm (ref 0.2–0.9)
Monocyte %: 10.2 %
NEUTROS ABS: 1.5 x10 3/mm (ref 1.4–6.5)
Neutrophil %: 64.2 %
Platelet: 104 x10 3/mm — ABNORMAL LOW (ref 150–440)
RBC: 3.77 10*6/uL — ABNORMAL LOW (ref 3.80–5.20)
RDW: 14.4 % (ref 11.5–14.5)
WBC: 2.4 x10 3/mm — ABNORMAL LOW (ref 3.6–11.0)

## 2014-08-08 ENCOUNTER — Ambulatory Visit
Admit: 2014-08-08 | Disposition: A | Discharge status: Home / Self Care | Payer: Self-pay | Attending: Oncology | Admitting: Oncology

## 2014-08-13 LAB — TSH: Thyroid Stimulating Horm: 1.832 u[IU]/mL

## 2014-08-13 LAB — COMPREHENSIVE METABOLIC PANEL
ALBUMIN: 3.2 g/dL — AB
ALK PHOS: 466 U/L — AB
ALT: 46 U/L
ANION GAP: 9 (ref 7–16)
BILIRUBIN TOTAL: 1 mg/dL
BUN: 7 mg/dL
CHLORIDE: 103 mmol/L
CO2: 23 mmol/L
Calcium, Total: 8.7 mg/dL — ABNORMAL LOW
Creatinine: 0.67 mg/dL
EGFR (African American): >60
EGFR (Non-African Amer.): >60
Glucose: 127 mg/dL — ABNORMAL HIGH
Potassium: 3.8 mmol/L
SGOT(AST): 35 U/L
Sodium: 135 mmol/L
Total Protein: 6.7 g/dL

## 2014-08-13 LAB — CBC CANCER CENTER
BASOS PCT: 0.2 %
Basophil #: 0 x10 3/mm (ref 0.0–0.1)
EOS ABS: 0.1 x10 3/mm (ref 0.0–0.7)
Eosinophil %: 0.9 %
HCT: 36 % (ref 35.0–47.0)
HGB: 12.3 g/dL (ref 12.0–16.0)
Lymphocyte #: 0.8 x10 3/mm — ABNORMAL LOW (ref 1.0–3.6)
Lymphocyte %: 13 %
MCH: 29.8 pg (ref 26.0–34.0)
MCHC: 34.2 g/dL (ref 32.0–36.0)
MCV: 87 fL (ref 80–100)
MONO ABS: 0.5 x10 3/mm (ref 0.2–0.9)
Monocyte %: 8.6 %
Neutrophil #: 4.5 x10 3/mm (ref 1.4–6.5)
Neutrophil %: 77.3 %
PLATELETS: 154 x10 3/mm (ref 150–440)
RBC: 4.14 10*6/uL (ref 3.80–5.20)
RDW: 15.7 % — ABNORMAL HIGH (ref 11.5–14.5)
WBC: 5.9 x10 3/mm (ref 3.6–11.0)

## 2014-08-19 ENCOUNTER — Emergency Department
Admit: 2014-08-19 | Disposition: A | Discharge status: Home / Self Care | Payer: Self-pay | Admitting: Emergency Medicine

## 2014-08-19 LAB — URINALYSIS, COMPLETE
BLOOD: NEGATIVE
Bacteria: NONE SEEN
Bilirubin,UR: NEGATIVE
Glucose,UR: NEGATIVE mg/dL (ref 0–75)
Ketone: NEGATIVE
LEUKOCYTE ESTERASE: NEGATIVE
Nitrite: NEGATIVE
PH: 6 (ref 4.5–8.0)
PROTEIN: NEGATIVE
Specific Gravity: 1.003 (ref 1.003–1.030)

## 2014-08-19 LAB — COMPREHENSIVE METABOLIC PANEL
ALT: 43 U/L
AST: 39 U/L
Albumin: 2.6 g/dL — ABNORMAL LOW
Alkaline Phosphatase: 336 U/L — ABNORMAL HIGH
Anion Gap: 9 (ref 7–16)
BUN: 5 mg/dL — ABNORMAL LOW
Bilirubin,Total: 1.1 mg/dL
CREATININE: 0.54 mg/dL
Calcium, Total: 8.3 mg/dL — ABNORMAL LOW
Chloride: 97 mmol/L — ABNORMAL LOW
Co2: 23 mmol/L
Glucose: 126 mg/dL — ABNORMAL HIGH
POTASSIUM: 3.6 mmol/L
SODIUM: 129 mmol/L — AB
Total Protein: 6 g/dL — ABNORMAL LOW

## 2014-08-19 LAB — CBC WITH DIFFERENTIAL/PLATELET
BASOS ABS: 0 10*3/uL (ref 0.0–0.1)
Basophil %: 0.5 %
EOS ABS: 0 10*3/uL (ref 0.0–0.7)
Eosinophil %: 0.4 %
HCT: 30 % — AB (ref 35.0–47.0)
HGB: 10 g/dL — ABNORMAL LOW (ref 12.0–16.0)
LYMPHS PCT: 8.7 %
Lymphocyte #: 0.3 10*3/uL — ABNORMAL LOW (ref 1.0–3.6)
MCH: 29.3 pg (ref 26.0–34.0)
MCHC: 33.4 g/dL (ref 32.0–36.0)
MCV: 88 fL (ref 80–100)
Monocyte #: 0.3 x10 3/mm (ref 0.2–0.9)
Monocyte %: 9.6 %
NEUTROS PCT: 80.8 %
Neutrophil #: 2.8 10*3/uL (ref 1.4–6.5)
Platelet: 98 10*3/uL — ABNORMAL LOW (ref 150–440)
RBC: 3.43 10*6/uL — ABNORMAL LOW (ref 3.80–5.20)
RDW: 15.7 % — AB (ref 11.5–14.5)
WBC: 3.4 10*3/uL — ABNORMAL LOW (ref 3.6–11.0)

## 2014-08-24 ENCOUNTER — Other Ambulatory Visit: Payer: Self-pay | Admitting: Oncology

## 2014-08-26 NOTE — Op Note (Signed)
PATIENT NAME:  Tiffany Gamble, Tiffany Gamble MR#:  [id number] DATE OF BIRTH:  [birth year]  DATE OF PROCEDURE:  01/17/2012  PREOPERATIVE DIAGNOSIS: Infected wound of the previous hernia scar area.   POSTOPERATIVE DIAGNOSIS: Infected wound of the previous hernia scar area.   PROCEDURES PERFORMED: Incision and drainage abscess and debridement of skin and subcutaneous tissue and the muscle and removal of the sutures from the wound.   SURGEON: Evelisse Szalkowski S. Kaidence Callaway, MD  INDICATIONS: This patient who had multiple surgeries in the past for multiple recurrent ventral hernias. She had a ventral hernia way up on the top which needed to be fixed but this lady yesterday came to my office. She had an open wound at the previous scar area. She was spitting pus from it now and in the past also she had the same problem and she had grew staph in the past from this wound. Took her two months to close. This is just one day before surgery so I brought her to surgery to debride this area and take out the stitch granulomas, stitch abscesses which is underneath the wound.   DESCRIPTION OF PROCEDURE: After she was put to sleep, incision was made in the middle of the incision and an up and down incision was made and culture was taken of the pus. The patient had multiple Prolene sutures which were spitting out. All those were then removed which were whatever I could feel I removed those and then one of the granulation tissue was going towards the suture and  so she is repeatedly spitting out these Prolene sutures and because of the pus and because of the previous history of infection this area did not do major surgery because other mesh will get infected. So after I removed all the stitches, washed out the area, kept the wound open with a 0.25-inch gauge and then once this wound closes then will go ahead and repair her surgery.   ____________________________ Welford Roche Phylis Bougie, MD msh:cms D: 01/17/2012 08:33:16 ET T: 01/17/2012 10:52:53  ET  JOB#: [id number] cc: Laythan Hayter S. Phylis Bougie, MD, <Dictator> Marguerita Merles, MD Sharene Butters MD ELECTRONICALLY SIGNED 01/21/2012 9:08

## 2014-08-27 LAB — CBC CANCER CENTER
BASOS PCT: 0.4 %
Basophil #: 0 x10 3/mm (ref 0.0–0.1)
EOS PCT: 1.4 %
Eosinophil #: 0.1 x10 3/mm (ref 0.0–0.7)
HCT: 34.3 % — ABNORMAL LOW (ref 35.0–47.0)
HGB: 11.4 g/dL — AB (ref 12.0–16.0)
LYMPHS PCT: 16.2 %
Lymphocyte #: 0.6 x10 3/mm — ABNORMAL LOW (ref 1.0–3.6)
MCH: 29.9 pg (ref 26.0–34.0)
MCHC: 33.3 g/dL (ref 32.0–36.0)
MCV: 90 fL (ref 80–100)
MONO ABS: 0.3 x10 3/mm (ref 0.2–0.9)
Monocyte %: 8.7 %
NEUTROS PCT: 73.3 %
Neutrophil #: 2.7 x10 3/mm (ref 1.4–6.5)
Platelet: 164 x10 3/mm (ref 150–440)
RBC: 3.83 10*6/uL (ref 3.80–5.20)
RDW: 18.6 % — ABNORMAL HIGH (ref 11.5–14.5)
WBC: 3.7 x10 3/mm (ref 3.6–11.0)

## 2014-08-27 LAB — COMPREHENSIVE METABOLIC PANEL
ALK PHOS: 387 U/L — AB
ANION GAP: 7 (ref 7–16)
Albumin: 2.5 g/dL — ABNORMAL LOW
BUN: 14 mg/dL
Bilirubin,Total: 0.7 mg/dL
CO2: 22 mmol/L
Calcium, Total: 8.2 mg/dL — ABNORMAL LOW
Chloride: 108 mmol/L
Creatinine: 0.56 mg/dL
EGFR (African American): >60
EGFR (Non-African Amer.): >60
Glucose: 125 mg/dL — ABNORMAL HIGH
POTASSIUM: 3.4 mmol/L — AB
SGOT(AST): 31 U/L
SGPT (ALT): 24 U/L
Sodium: 137 mmol/L
Total Protein: 5.5 g/dL — ABNORMAL LOW

## 2014-08-28 ENCOUNTER — Ambulatory Visit
Admit: 2014-08-28 | Disposition: A | Discharge status: Home / Self Care | Payer: Self-pay | Attending: Oncology | Admitting: Oncology

## 2014-08-29 NOTE — Discharge Summary (Signed)
PATIENT NAME:  Tiffany Gamble, Tiffany Gamble MR#:  [id number] DATE OF BIRTH:  [birth year]  DATE OF ADMISSION:  02/12/2013 DATE OF DISCHARGE:  02/14/2013  ADMITTING DIAGNOSIS: Right total knee, unstable.   DISCHARGE DIAGNOSIS: Right total knee, unstable.   PROCEDURE: Polyethylene tibial component exchange.   ANESTHESIA: Spinal.   SURGEON: Laurene Footman, M.D.   ASSISTANT: Rachelle Hora, PA-C.   ESTIMATED BLOOD LOSS: 100 mL.   COMPLICATIONS: None.   SPECIMENS: None.   IMPLANTS: Size 3, 17 mm UCGMK tibial insert.   HISTORY: The patient is a 69 year old female, who had a total knee replacement on the right on 09/22/2011 by Dr. Rudene Christians. She did very well until this summer when she sprained her knee. She has been having persistent instability since that time with increased pain. She comes back today with persisting pain.   PHYSICAL EXAMINATION:  GENERAL: Well developed, well nourished female in no apparent distress. Normal affect. Slight antalgic component to the right lower extremity.  HEENT: She has upper and lower dentures.  HEART: Regular rate and rhythm.  LUNGS: Clear to auscultation bilaterally. No wheezing, rales or rhonchi.  EXTREMITIES: Right knee: Examination of the right knee shows the patient has full extension on exam, flexion to 120 degrees. There is varus and valgus instability. Lachman is stable. Prior x-rays revealed no loosening.   HOSPITAL COURSE: The patient was admitted to the hospital on 02/12/2013. She had surgery that same day and was brought to the orthopedic floor and PACU in stable condition. On postoperative day 1, the patient's platelet count was down to 102. Her vital signs remained stable. Pain was controlled and she made good progress with physical therapy ambulating 120 feet with a walker. On postoperative day 2, platelet count dropped to 95. Her Lovenox was discontinued. The patient continued to make good progress with physical therapy. Vital signs remained stable and she  was doing well.   CONDITION AT DISCHARGE: Stable. The patient was ready for discharge to home with home health physical therapy.   DISCHARGE INSTRUCTIONS:  1.  The patient may increase weight-bearing on the affected extremity as tolerated.  2.  She is to elevate the affected foot or leg on 1 or 2 pillows with the foot higher than the knee. 3.  She is to wear knee-high TED hose on both legs and remove at bedtime and replace when arising the next morning.  4.  Elevate the heels off the bed.  5.  Use incentive spirometry every hour while awake and encourage cough and deep breathing.  6.  She may resume a regular diet as tolerated.  7.  Apply an ice pack to the affected area.  8.  Do not get the dressing or bandage wet or dirty.  9.  Call [facility] orthopedics if the dressing gets water under it. Leave the dressing on. 10. Call [facility] orthopedics if any of the following occur: Bright red bleeding from the incision wound, fever above 101.5 degrees, redness, swelling, or drainage at the incision.  28. Call [facility] orthopedics if you experience any increased leg pain, numbness or weakness in legs or bowel or bladder symptoms.  12. Home physical therapy has been arranged for continuation of rehab program. Please call [facility] orthopedics if the therapist has not contacted you within 48 hours of your return home.   DISCHARGE MEDICATIONS: Spiriva 18 mcg inhalation capsule 1 cap inhaled once a day in the morning, Ursodiol 300 mg oral capsule 1 cap orally 3 times  a day, amitriptyline 100 mg oral tablet 1 tablet orally once a day, Flonase 50 mcg inhalation nasal spray 2 sprays nasally 2 times a day, Tizanidine 4 mg 2 tabs orally every 8 hours as needed, oxycodone 5 mg 1 tablet orally every 4 hours as needed for pain, Nucynta 50 mg oral tablet 1 tablet orally every 8 hours, magnesium hydroxide 8% oral suspension 30 mL orally 2 times a day as needed for constipation, albuterol 90 mcg  inhalation aerosol inhaled and bisacodyl 10 mg rectal suppository 1 suppository rectally once a day as needed for constipation.  ____________________________ T. Rachelle Hora, PA-C tcg:aw D: 02/14/2013 08:13:49 ET T: 02/14/2013 08:29:37 ET JOB#: [id number]  cc: T. Rachelle Hora, PA-C, <Dictator> Duanne Guess [place] ELECTRONICALLY SIGNED 02/26/2013 11:17

## 2014-08-29 NOTE — Op Note (Signed)
PATIENT NAME:  Tiffany Gamble, Tiffany Gamble MR#:  [id number] DATE OF BIRTH:  Sep 18, 1945  DATE OF PROCEDURE:  02/12/2013  PREOPERATIVE DIAGNOSIS: Unstable right total knee.   POSTOPERATIVE DIAGNOSIS: Unstable right total knee.    PROCEDURE: Polyethylene tibial component exchange.   ANESTHESIA: Spinal.   SURGEON: Laurene Footman, M.D.   ASSISTANT: Rachelle Hora, PA-C.   DESCRIPTION OF PROCEDURE: After adequate spinal anesthesia was obtained, the right leg was prepped and draped in the usual sterile fashion with a tourniquet applied to the upper thigh but not utilized during the procedure. After prepping and draping in the usual sterile fashion, appropriate patient identification and timeout procedures were completed. A midline skin incision was made, followed by a medial parapatellar arthrotomy. Inspection revealed no apparent problems within the joint other than instability to varus and slight opening to valgus stress consistent with history of MCL sprain. After adequate exposure was obtained with removal of scar behind the patellar tendon, the tibial component was removed and Exparel was  injected around the joint for postoperative analgesia. A 14 mm size 3 component was removed. A 17 mm component gave excellent stability, and after thorough irrigation of the joint, it was placed and snapped into place. Full extension was obtained along with flexion, with excellent stability to varus and valgus stress in extension and mid flexion. The arthrotomy was then repaired using a heavy quill suture, 2-0 quill subcutaneously, followed by skin staples. The wound was dressed with Xeroform, 4 x 4's, ABD, Webril and Ace wrap. The patient was sent to the recovery room in stable condition.   ESTIMATED BLOOD LOSS: 100 mL.   COMPLICATIONS: None.   SPECIMEN: None.   IMPLANTS: Size 3, 17 mm UCGMK tibial insert.    ____________________________ Laurene Footman, MD mjm:gb D: 02/12/2013 21:12:42 ET T: 02/12/2013 22:20:11  ET JOB#: [id number]  cc: Laurene Footman, MD, <Dictator> Laurene Footman MD ELECTRONICALLY SIGNED 02/13/2013 8:03

## 2014-08-30 NOTE — Op Note (Signed)
PATIENT NAME:  Tiffany Gamble, Tiffany Gamble MR#:  [id number] DATE OF BIRTH:  1946-03-12  DATE OF PROCEDURE:  08/01/2013  PREOPERATIVE DIAGNOSIS: Right knee synovitis.   POSTOPERATIVE DIAGNOSIS:  Right knee synovitis.  PROCEDURE: Arthroscopy, right knee.   ANESTHESIA: General.   SURGEON: Hessie Knows, M.D.   DESCRIPTION OF PROCEDURE: The patient was brought to the operating room and after adequate anesthesia was obtained, the right leg was placed in the arthroscopic legholder with the tourniquet applied. After prepping and draping, the appropriate patient identification and timeout procedures were completed. An inferolateral portal was made. There was no synovial fluid which came out for analysis, and so no specimen was obtained. The arthroscope was introduced and the knee distended. An inferolateral portal was made, and then inspection revealed synovitis medially, as well as around the patella. There were no loose bodies and there was no significant impingement in the anterior joint space. An ArthroCare wand and shaver were used to resect the synovium that appeared to be impinging at the patellofemoral joint and around the patella. After this was adequately excised, the knee was irrigated until clear. The wounds were closed with simple interrupted 4-0 nylon skin sutures. Xeroform, 4 x 4's, Webril and Ace wrap were applied and the patient was sent to the recovery room in stable condition.   ESTIMATED BLOOD LOSS: Minimal.   COMPLICATIONS: None.   SPECIMEN:  None.   ____________________________ Laurene Footman, MD mjm:dmm D: 08/01/2013 19:45:54 ET T: 08/01/2013 21:10:09 ET JOB#: [id number]  cc: Laurene Footman, MD, <Dictator> Laurene Footman MD ELECTRONICALLY SIGNED 08/02/2013 11:55

## 2014-08-31 ENCOUNTER — Emergency Department
Admit: 2014-08-31 | Disposition: A | Discharge status: Home / Self Care | Payer: Self-pay | Admitting: Emergency Medicine

## 2014-08-31 LAB — COMPREHENSIVE METABOLIC PANEL
ALBUMIN: 2.3 g/dL — AB
ALT: 37 U/L
Alkaline Phosphatase: 259 U/L — ABNORMAL HIGH
Anion Gap: 7 (ref 7–16)
BUN: 11 mg/dL
Bilirubin,Total: 1.3 mg/dL — ABNORMAL HIGH
CO2: 25 mmol/L
CREATININE: 0.45 mg/dL
Calcium, Total: 8.1 mg/dL — ABNORMAL LOW
Chloride: 102 mmol/L
EGFR (African American): >60
EGFR (Non-African Amer.): >60
Glucose: 127 mg/dL — ABNORMAL HIGH
POTASSIUM: 3.8 mmol/L
SGOT(AST): 53 U/L — ABNORMAL HIGH
Sodium: 134 mmol/L — ABNORMAL LOW
Total Protein: 5.5 g/dL — ABNORMAL LOW

## 2014-08-31 LAB — CBC
HCT: 27.9 % — ABNORMAL LOW (ref 35.0–47.0)
HGB: 9.1 g/dL — ABNORMAL LOW (ref 12.0–16.0)
MCH: 29.6 pg (ref 26.0–34.0)
MCHC: 32.8 g/dL (ref 32.0–36.0)
MCV: 90 fL (ref 80–100)
Platelet: 95 10*3/uL — ABNORMAL LOW (ref 150–440)
RBC: 3.08 10*6/uL — ABNORMAL LOW (ref 3.80–5.20)
RDW: 17.8 % — AB (ref 11.5–14.5)
WBC: 4.1 10*3/uL (ref 3.6–11.0)

## 2014-08-31 LAB — TROPONIN I: Troponin-I: <0.03 ng/mL

## 2014-08-31 NOTE — Op Note (Signed)
PATIENT NAME:  Tiffany, Gamble MR#:  [id number] DATE OF BIRTH:  Mar 16, 1946  DATE OF PROCEDURE:  07/26/2011  PREOPERATIVE DIAGNOSIS: Painful hardware, right proximal tibia.   POSTOPERATIVE DIAGNOSIS: Painful hardware, right proximal tibia.   PROCEDURE: Removal of proximal tibial hardware, right leg.   ANESTHESIA: General.   SURGEON: Laurene Footman, MD    DESCRIPTION OF PROCEDURE: The patient was brought to the operating room and after adequate anesthesia was obtained, the leg was prepped and draped in the usual sterile fashion. After patient identification and time-out procedures were completed, an anterior midline skin incision was made incorporating the distal portion of the prior incision. Lateral flap was elevated and the proximal tibia exposed. The tourniquet was raised at this point secondary to muscular bleeding. After elevation of the soft tissues around the plate, the two distal screws were removed without difficulty along with the proximal anterior screw. The posterior screw was very posterior and without extending the incision extensively it would have been difficult to remove so a small incision was made more laterally and the subcutaneous tissue spread. The head of the screw could be captured with the screwdriver and the screw removed without difficulty. The entire plate was then removed. The wound was thoroughly irrigated. There was a bursal overlying the plate with some metallosis, otherwise tissue appeared intact. The deep fascia was repaired using a running #1 Vicryl, 2-0 Vicryl subcutaneously, and skin staples. The wound was infiltrated with 20 mL of 0.5% Sensorcaine without epinephrine. A compressive dressing of Xeroform, 4 x 4's, Webril, and Ace wrap applied. The patient was sent to the recovery room in stable condition.   ESTIMATED BLOOD LOSS: 25 mL.   COMPLICATIONS: None.   SPECIMENS: None. Hardware discarded per patient request.     TOURNIQUET TIME: 23 minutes at 300  mmHg.   ____________________________ Laurene Footman, MD mjm:drc D: 07/26/2011 17:43:49 ET T: 07/27/2011 09:16:04 ET JOB#: [id number]  cc: Laurene Footman, MD, <Dictator> Laurene Footman MD ELECTRONICALLY SIGNED 07/27/2011 17:12

## 2014-08-31 NOTE — Op Note (Signed)
PATIENT NAME:  Tiffany Gamble, Tiffany Gamble MR#:  [id number] DATE OF BIRTH:  [birth year]  DATE OF PROCEDURE:  09/22/2011  PREOPERATIVE DIAGNOSIS: Right knee posttraumatic arthritis.   POSTOPERATIVE DIAGNOSIS: Right knee posttraumatic arthritis.  PROCEDURE: Right total knee replacement.   SURGEON: Laurene Footman, MD  ASSISTANT: Rachelle Hora, PA-C   ANESTHESIA: Spinal.   DESCRIPTION OF PROCEDURE: The patient was brought to the operating room and after adequate spinal anesthesia was obtained, the right leg was prepped and draped in the usual sterile fashion with a tourniquet applied to the upper thigh. After prepping and draping in the usual sterile manner, appropriate patient identification and time-out procedures were carried. An Alvarado legholder was also used during the procedure. The leg was exsanguinated with use of an Esmarch and the tourniquet raised at the start of the case to 300 mmHg a midline skin incision was made followed by a medial parapatellar arthrotomy. There was significant wear particularly in the medial compartment as well as advanced arthritis of the patellofemoral joint. The anterior cruciate ligament was excised along with the fat pad with adequate exposure proximally. The tibial cutting guide from the [place] patient-specific cutting block was applied and pins placed, alignment checked, and the proximal tibial cut carried out. After this the femoral side was easy to manage in a similar fashion, applying the cutting block, placing pins, checking alignment, and performing the distal femoral cut. The size four cutting block was applied. Anterior, posterior, and chamfer cuts carried out without notching. At this point the residual horns of the menisci were excised and #3 tibia was placed as a trial on the tibial side with the appropriate rotation based on the pin left from the tibial guide. The #4 femur trial was placed and a 14-mm insert gave excellent stability in mid flexion, extension and  flexion. The distal femoral drill holes were made as well as the trochlear cut in the femur at this time. The tibia was cut with the cutting guide and sized to a size 2. Three drill holes were made and with trial components the knee had excellent range of motion and stability. All trial components were removed at this time. A combination of 0.25% Sensorcaine with epinephrine and 10 mg of morphine was injected in the posterior capsule and medial arthrotomy. The bony surfaces were thoroughly irrigated and dried. The tibial component was cemented into place first followed by the polyethylene component, removing excess cement as possible. The femoral component was then cemented into place and the knee was held in extension followed by the patellar button, which was clamped into place. All components were cemented with Simplex B bone cement. After the cement had set, excess cement was removed. The wound was again thoroughly irrigated and the tourniquet was let down to check hemostasis. Following hemostasis, the arthrotomy was repaired using a running quill suture, 2-0 quill subcutaneously and staples followed by Xeroform, 4 x 4's, Webril, and Ace wrap as well as Polar Care and knee immobilizer.   ESTIMATED BLOOD LOSS: 50 mL.   TOURNIQUET TIME: 63 minutes at 300 mmHg.  IMPLANTS:  Medacta GMK primary size 4N right standard, tibia GMK primary tibial tray size 3, with a 14-mm UC tibial insert, and a size 2 GMK primary resurfacing patella.   CONDITION: To recovery room stable.   SPECIMEN: Cut ends of bone.    ____________________________ Laurene Footman, MD mjm:bjt D: 09/22/2011 21:58:12 ET T: 09/23/2011 10:08:06 ET JOB#: [id number]  cc: Laurene Footman, MD, <Dictator> Lulu Hirschmann J  [facility] MD ELECTRONICALLY SIGNED 09/23/2011 12:56

## 2014-08-31 NOTE — Op Note (Signed)
PATIENT NAME:  Tiffany Gamble, Tiffany Gamble MR#:  [id number] DATE OF BIRTH:  19-Dec-1945  DATE OF PROCEDURE:  06/23/2011  PREOPERATIVE DIAGNOSIS: Ventral hernia.   POSTOPERATIVE DIAGNOSIS: Ventral hernia.  PROCEDURE: Repair of ventral hernia with mesh.   SURGEON: Vella Kohler, M.D.   INDICATIONS: This patient is a 69 year old female who has had multiple surgeries for a ventral hernia, about six or seven of them, and it recurred again in the middle part of her abdomen she is pushing out a lot of small bowel from there and having a lot of pain. The patient was then brought to surgery.   OPERATIVE FINDINGS: The patient had a large ventral hernia which was dissected free and then was repaired with a mesh.   DESCRIPTION OF PROCEDURE: Under general anesthesia, the abdomen was then prepped and draped. A transverse incision was made just below the umbilicus cutting through skin and subcutaneous tissue. The patient right away was found to have a large hernia sac. We dissected from the bellybutton and the hernia sac was opened up. A gap was between the lower incision and the top of the incision. I put my finger in the peritoneum and felt the upper part.  It looked very good. I could not feel the subcostal incision as it was so high. After dissecting out completely, I put a piece of circular mesh in there and sutured it with the fascia and also closed the fascia together. All in all a very good repair was done. The subcutaneous tissue was closed with 4-0 Vicryl and the skin was closed with subcuticular suturing and Steri-Strips applied. The patient tolerated the procedure well and was sent to the Recovery Room in satisfactory condition.  ____________________________ Welford Roche Phylis Bougie, MD msh:slb D: 06/23/2011 13:13:03 ET     T: 06/23/2011 14:00:19 ET       JOB#: [id number] cc: Marguerita Merles, MD Sharene Butters MD ELECTRONICALLY SIGNED 06/24/2011 9:12

## 2014-08-31 NOTE — Discharge Summary (Signed)
PATIENT NAME:  Tiffany Gamble, Tiffany Gamble MR#:  [id number] DATE OF BIRTH:  [birth year]  DATE OF ADMISSION:  09/22/2011 DATE OF DISCHARGE:  09/26/2011   ADMITTING DIAGNOSIS: Right knee posttraumatic arthritis.   DISCHARGE DIAGNOSIS: Right knee posttraumatic arthritis.  PROCEDURE: Right total knee replacement.   SURGEON: Laurene Footman, MD   ASSISTANT: Rachelle Hora, PA-C   ANESTHESIA: Spinal.  ESTIMATED BLOOD LOSS: 50 mL.  TOURNIQUET TIME: 63 minutes at 300 mmHg.  IMPLANTS: Medacta GMK primary size 4N right standard, tibia GMK primary tibial tray size 3 with a 14 mm UC tibial insert and a size 2 GMK primary resurfacing patella.   CONDITION TO RECOVERY ROOM: Stable.  SPECIMEN: Cut ends of bone.   The patient was stabilized, brought to the recovery room, and then brought down to the orthopedic floor where she was treated by physical therapy and pain control.   HISTORY: The patient is a 69 year old female who 15 years ago had a proximal tibial osteotomy done at [facility]. The patient developed some arthritis and increasing knee pain. She had her hardware removed back in March. The patient continued to have increase in pain within the right knee. She received physical therapy as well as injections. The patient failed nonoperative measures for posttraumatic osteoarthritis of her right knee. She had a knee CT done and total knee was scheduled for 09/22/2011.   PHYSICAL EXAMINATION: GENERAL: Well developed, well nourished female in no apparent distress. HEENT: Head is normocephalic, atraumatic. Has dentures. EYES: Pupils equal, round, and reactive to light and accommodation. NECK: Symmetrical with no swelling. LUNGS: Clear to auscultation. HEART: Regular rate and rhythm. RIGHT LOWER EXTREMITY: Right knee is painful with range of motion with full extension and flexion to 110 degrees. There is slight valgus deformity that is passively correctable. No instability to varus, valgus, posterior drawer, or Lachman.  She has some slight limitation on neck extension.  HOSPITAL COURSE: After initial admission on 09/22/2011, the patient had surgery that same day. The patient had good pain control afterwards and was brought to the orthopedic floor from the PAC-U. The patient's hemoglobin remained stable. Glucose was monitored and remained stable. The patient was found to have a urinary tract infection on 09/24/2011 and was placed on Macrobid  in which her cultures showed sensitivity. Postop day three the patient had a bowel movement. The patient tolerated physical therapy and had good range of motion to about 95 degrees flexion. On 09/26/2011 the patient was stable and ready to go to rehab.   CONDITION AT DISCHARGE: Stable.   DISPOSITION: The patient was sent to rehab.  DISCHARGE INSTRUCTIONS: 1. The patient will gradually increase weightbearing on the affected extremity as tolerated.  2. She will keep the leg elevated.  3. She will continue wearing knee-high TEDs with removal at bedtime.  4. She will consume a regular diet with low carbs.  5. She was given oxycodone 5 to 10 mg every four hours as needed for pain.  6. She is to apply ice to the affected area using the Polar Care unit maintaining a temperature between 40 and 50 degrees. 7. She is not to get the dressing wet or dirty.  8. She is to call [facility] if any of the following occur: Bright red bleeding or fever above 101.5 degrees. 9. The patient is to continue with physical therapy.  10. She is scheduled for an appointment in 10 days with [place].   DISCHARGE MEDICATIONS: 1. Flexeril 5 mg oral tablet 1  tablet orally three a day. 2. Proventil HFA 90 mcg inhaler inhalation aerosol with adapter 2 puffs inhaled 4 times a day as needed.  3. Percocet 5/325 oral tablet 1 tablet orally every six hours as needed for pain. 4. Ativan 1 mg oral tablet 1 tablet orally 2 times a day as needed.  5. Sulfamethoxazole 2 times a  day. 6. Spiriva 18 mcg inhalation capsule one cap inhaled once a day in the morning. 7. Ursodiol 300 mg oral capsule one cap orally 3 times a day.   ____________________________ Duanne Guess, PA-C tcg:drc D: 09/26/2011 12:44:07 ET T: 09/26/2011 13:04:36 ET JOB#: [id number]  cc: Duanne Guess, PA-C, <Dictator> Duanne Guess [place] ELECTRONICALLY SIGNED 10/04/2011 12:30

## 2014-09-01 ENCOUNTER — Emergency Department
Admit: 2014-09-01 | Disposition: A | Discharge status: Home / Self Care | Payer: Self-pay | Admitting: Emergency Medicine

## 2014-09-01 ENCOUNTER — Other Ambulatory Visit: Payer: Self-pay | Admitting: Oncology

## 2014-09-01 LAB — COMPREHENSIVE METABOLIC PANEL
ALT: 35 U/L
ANION GAP: 5 — AB (ref 7–16)
Albumin: 2.3 g/dL — ABNORMAL LOW
Alkaline Phosphatase: 325 U/L — ABNORMAL HIGH
BUN: 8 mg/dL
Bilirubin,Total: 1.4 mg/dL — ABNORMAL HIGH
CHLORIDE: 100 mmol/L — AB
CO2: 27 mmol/L
Calcium, Total: 8 mg/dL — ABNORMAL LOW
Creatinine: 0.53 mg/dL
EGFR (African American): >60
EGFR (Non-African Amer.): >60
Glucose: 107 mg/dL — ABNORMAL HIGH
POTASSIUM: 3.4 mmol/L — AB
SGOT(AST): 42 U/L — ABNORMAL HIGH
Sodium: 132 mmol/L — ABNORMAL LOW
Total Protein: 5.6 g/dL — ABNORMAL LOW

## 2014-09-01 LAB — CBC
HCT: 28.5 % — ABNORMAL LOW (ref 35.0–47.0)
HGB: 9.5 g/dL — ABNORMAL LOW (ref 12.0–16.0)
MCH: 30.1 pg (ref 26.0–34.0)
MCHC: 33.3 g/dL (ref 32.0–36.0)
MCV: 91 fL (ref 80–100)
PLATELETS: 97 10*3/uL — AB (ref 150–440)
RBC: 3.15 10*6/uL — AB (ref 3.80–5.20)
RDW: 17.8 % — AB (ref 11.5–14.5)
WBC: 1.5 10*3/uL — CL (ref 3.6–11.0)

## 2014-09-01 LAB — CYTOLOGY - NON PAP

## 2014-09-01 LAB — DIFFERENTIAL
Lymphocytes: 26 %
MONOS PCT: 12 %
Segmented Neutrophils: 62 %

## 2014-09-01 LAB — TROPONIN I

## 2014-09-01 LAB — SURGICAL PATHOLOGY

## 2014-09-02 ENCOUNTER — Ambulatory Visit
Admit: 2014-09-02 | Disposition: A | Discharge status: Home / Self Care | Payer: Self-pay | Attending: Oncology | Admitting: Oncology

## 2014-09-07 NOTE — Discharge Summary (Signed)
PATIENT NAME:  Tiffany Gamble, RENY MR#:  [id number] DATE OF BIRTH:  04-Nov-1945  DATE OF ADMISSION:  07/03/2014 DATE OF DISCHARGE:  07/04/2014  PRIMARY CARE PHYSICIAN: Enid Derry, MD   ONCOLOGIST: Kathlene November. Grayland Ormond, MD  FINAL DIAGNOSES:  1.  Small pancreatic hemorrhage after biopsy, admitted as an observation.  2.  Pancreatic mass.  3.  Thrombocytopenia.  4.  Chronic obstructive pulmonary disease.  5.  Chronic pain.  6.  Gastroesophageal reflux disease without esophagitis.   MEDICATIONS ON DISCHARGE: Include Spiriva 1 inhalation daily, Flonase 50 mcg per spray 2 sprays each nostril twice a day, Ventolin HFA 2 puffs 4 times a day, MiraLax 17 grams twice a day, donepezil 5 mg 1 tablet at bedtime, cyclobenzaprine 10 mg 3 times a day as needed, Protonix 40 mg twice a day, multivitamin 1 tablet daily, ursodiol 300 mg 1 capsule 4 times a day, oxycodone 10 mg 4 times a day as needed for pain.   DIET: Regular diet, regular consistency.   ACTIVITY: As tolerated.   FOLLOWUP: With Dr. Grayland Ormond on Wednesday for biopsy results.   HOSPITAL COURSE: The patient was admitted as an observation on 07/03/2014 after an interventional radiology showed a pancreatic hemorrhage after a pancreatic biopsy. The patient was given IV fluids and watched closely.   LABORATORY AND RADIOLOGICAL DATA DURING THE HOSPITAL COURSE: Included a magnesium of 2.1, glucose 156, BUN 12, creatinine 0.77, sodium 141, potassium 4.2, chloride 106, CO2 of 24, calcium 8.7. INR 1.1. Hemoglobin 13.4, platelet count 97,000, white count 5.3. CT scan showed a CT-guided needle aspirate and core biopsy of the pancreas complicated by hemorrhage along the needle tract anterior to the pancreas. Hemoglobin upon discharge 12.6, platelet count 89,000, white count 3.9. Chemistry within normal limits.   HOSPITAL COURSE PER PROBLEM LIST:  1.  For the patient's small pancreatic hemorrhage after biopsy, the patient did not have any worsening abdominal  pain. Hemoglobin remained relatively stable even though we gave IV fluids. The hemoglobin did come down a little bit. The patient was stable for discharge. She tolerated diet and was discharged home.  2.  Pancreatic mass: Pancreatic biopsy is still pending at this time. Follow up with Dr. Grayland Ormond as outpatient.  3.  Thrombocytopenia, looks chronic.  4.  COPD: Respiratory status stable on inhalers.  5.  Chronic pain: She is on oxycodone.  6.  Gastroesophageal reflux disease without esophagitis: On Protonix.   TIME SPENT ON DISCHARGE: 35 minutes.    ____________________________ Tana Conch. Leslye Peer, MD rjw:bm D: 07/04/2014 15:38:43 ET T: 07/05/2014 02:14:43 ET JOB#: [id number]  cc: Tana Conch. Leslye Peer, MD, <Dictator> Enid Derry, MD Kathlene November. Grayland Ormond, MD  Marisue Brooklyn MD ELECTRONICALLY SIGNED 07/16/2014 10:45

## 2014-09-07 NOTE — H&P (Signed)
PATIENT NAME:  Tiffany Gamble, Tiffany Gamble MR#:  [id number] DATE OF BIRTH:  1945/05/27  DATE OF ADMISSION:  07/03/2014  PRIMARY CARE PHYSICIAN:  Enid Derry, MD  REFERRING PHYSICIAN:  Aletta Edouard, MD, radiologist.  CHIEF COMPLAINT:  Pancreatic hemorrhage after biopsy.  HISTORY OF PRESENT ILLNESS: A 69 year old Caucasian female with a history of chronic obstructive pulmonary disease and recent diagnosed pancreatic mass who came to ED for procedure of her pancreatic head biopsy.  After the procedure, Dr. Kathlene Cote found some small hemorrhage around the biopsied area so Dr. Kathlene Cote called hospitalists for admission. The patient is alert, awake, oriented, and in no acute distress. The patient denies any symptoms except chronic abdominal pain which is mild and constant without radiation.   PAST MEDICAL HISTORY: Chronic obstructive pulmonary disease, OM cyst, pancreatic mass diagnosed recently, and rheumatoid arthritis. Liver disease with liver cirrhosis, esophageal varices, multiple drug-resistant organisms, diabetes, and lupus.    PAST SURGICAL HISTORY: Gastric bypass, multiple abdominal surgeries, appendectomy, lumpectomy, cholecystectomy, and bilateral knee surgery.   FAMILY HISTORY: Mother had lymphoma, diabetes, liver disease, and bladder cancer.   SOCIAL HISTORY: Remote tobacco use but denies any alcohol drinking or illicit drugs.   ALLERGIES:  CODEINE , IBUPROFEN, LOTRIMIN, MOTRIN, PENICILLIN, TYLENOL, AND VICODIN.  HOME MEDICATIONS:  Medication reconciliation list is not done yet.  REVIEW OF SYSTEMS: CONSTITUTIONAL: The patient denies any fever, chills. No headache or dizziness or weakness.  EYES: No double vision or blurred vision.  EARS, NOSE, AND THROAT: No postnasal drip, slurred speech, or dysphagia.  CARDIOVASCULAR: No chest pain, palpitation, orthopnea, or nocturnal dyspnea. No leg edema.  PULMONARY: No cough, sputum, shortness of breath, or hematemesis.  GASTROINTESTINAL: No  abdominal pain, nausea, vomiting, or diarrhea. No melena or bloody stool.  GENITOURINARY: No dysuria, hematuria, or incontinence.  SKIN: No rash or jaundice.  NEUROLOGIC: No syncope, loss of consciousness, or seizure.  ENDOCRINE: No polyuria, polydipsia, heat or cold intolerance.  HEMATOLOGY: No easy bleeding or bleeding.   PHYSICAL EXAMINATION:  VITAL SIGNS: Temperature 98.7, blood pressure 116/83, pulse 90, oxygen saturation 94% on room air. GENERAL: The patient is alert, awake, oriented, in no acute distress.  HEENT: Pupils round and reactive to light and accommodation. Moist oral mucosa. Clear oropharynx.  NECK: Supple. No JVD or carotid bruit. No lymphadenopathy. No thyromegaly.  CARDIOVASCULAR: S1 and S2. Regular rate and rhythm. No murmurs or gallops.  PULMONARY: Bilateral air entry. No wheezing or rales. No use of accessory muscle to breathe.  ABDOMEN: Soft. No distention but has diffuse tenderness. No rigidity. No rebound. Bowel sounds present. No obvious organomegaly. Bowel sounds present.  EXTREMITIES: No edema, clubbing, or cyanosis. No calf tenderness. Bilateral pedal pulses present. SKIN: No rash or jaundice.  NEUROLOGIC: Alert and oriented x 3. No focal deficit. Power 5/5. Sensation intact.   LABORATORY DATA: WBC 5.3, hemoglobin 13.4, Platelet  97. INR 1.1.   IMPRESSION:  1.  Pancreatic hemorrhage after biopsy. 2.  Pancreatic mass.  3.  Liver cirrhosis.  4.  Chronic obstructive pulmonary disease.  5.  Rheumatoid arthritis.   PLAN OF TREATMENT:  1.  The patient will be placed for observation. We will give oxycodone p.r.n. for pain control and monitor hemoglobin tomorrow morning and then we will follow up her radiologist, Dr. Kathlene Cote, for further imaging tomorrow.  2.  Continue the patient's home nebulizer and other home medications.  Hopefully the patient will be discharged to home tomorrow. I discussed the patient's condition and plan of treatment with the  patient and  the patient's sisters. The patient wants DO NOT RESUSCITATE.   TIME SPENT: About 54 minutes.    ___________________________ Demetrios Loll, MD qc:mc D: 07/03/2014 13:15:00 ET T: 07/03/2014 13:48:34 ET JOB#: [id number]  cc: Demetrios Loll, MD, <Dictator> Demetrios Loll MD ELECTRONICALLY SIGNED 07/05/2014 13:42

## 2014-09-07 NOTE — Op Note (Signed)
PATIENT NAME:  Tiffany Gamble, Tiffany Gamble MR#:  [id number] DATE OF BIRTH:  07/08/1945  DATE OF PROCEDURE:  07/22/2014  PREOPERATIVE DIAGNOSIS: Pancreatic cancer, need for central venous access.   POSTOPERATIVE DIAGNOSIS:   Pancreatic cancer, need for central venous access.    OPERATIVE PROCEDURE: Right subclavian PowerPort placement with ultrasound and fluoroscopic guidance.   SURGEON: Robert Bellow, MD  ANESTHESIA: Attended local, 15 mL 1% plain Xylocaine.   ESTIMATED BLOOD LOSS: Minimal.   CLINICAL NOTE: This 69 year old woman was recently diagnosed with stage IV carcinoma of the pancreas and is considered a candidate for adjuvant chemotherapy. Central venous access was requested by the treating oncologist. Platelet count today had shown a modest fall to 91,000 felt adequate to proceed.   The patient was brought to the Operating Room after receiving Kefzol intravenously, underwent sedation. The right chest and neck was prepped with ChloraPrep and draped. Ultrasound was used to confirm patency of the subclavian vein. The vein was cannulated under ultrasound guidance. Guidewire was passed, followed by the dilator and catheter. This was positioned at the junction of the SVC and right atrium. It was tunneled to a pocket on the right anterior chest. The port was anchored to the deep tissue with 3-0 Prolene sutures. The port easily irrigated and aspirated. The wound was closed in layers with 3-0 Vicryl to the adipose tissue and a running 4-0 Vicryl subcuticular suture for the skin. Benzoin, Steri-Strips, Telfa and Tegaderm dressings were applied.   Erect portable chest x-ray in the recovery room showed the catheter tip as described above and no evidence of pneumothorax.    ____________________________ Robert Bellow, MD jwb:tr D: 07/22/2014 12:32:35 ET T: 07/22/2014 13:02:37 ET JOB#: [id number]  cc: Robert Bellow, MD, <Dictator> Enid Derry, MD Kathlene November. Grayland Ormond, MD  Escher Harr Amedeo Kinsman MD ELECTRONICALLY SIGNED 07/23/2014 10:50

## 2014-09-08 ENCOUNTER — Other Ambulatory Visit: Payer: Self-pay | Admitting: Oncology

## 2014-09-08 ENCOUNTER — Other Ambulatory Visit: Payer: Self-pay | Admitting: *Deleted

## 2014-09-08 MED ORDER — FUROSEMIDE 40 MG PO TABS: 40.0000 mg | ORAL_TABLET | Freq: Every day | ORAL | Status: DC

## 2014-09-08 NOTE — Progress Notes (Signed)
Received call from Tiffany Gamble. Pt was unable to get Lasix 20 mg filled due it not being a month from previous refill. She took 2 tabs to equal 40 mg as she was directed in the ED. She has not taken any lasix over the weekend.Tiffany Gamble consulted with Dr Grayland Ormond and lasix 40mg  called into pharmacy. Pt notified. Has appt Wed to see Dr Grayland Ormond.

## 2014-09-10 ENCOUNTER — Inpatient Hospital Stay: Payer: Medicare Other | Attending: Oncology | Admitting: Oncology

## 2014-09-10 ENCOUNTER — Other Ambulatory Visit: Payer: Self-pay | Admitting: *Deleted

## 2014-09-10 ENCOUNTER — Encounter: Payer: Self-pay | Admitting: Oncology

## 2014-09-10 ENCOUNTER — Ambulatory Visit: Payer: Medicare Other | Admitting: Oncology

## 2014-09-10 ENCOUNTER — Inpatient Hospital Stay: Payer: Medicare Other | Admitting: *Deleted

## 2014-09-10 ENCOUNTER — Ambulatory Visit: Payer: Medicare Other

## 2014-09-10 ENCOUNTER — Other Ambulatory Visit: Payer: Medicare Other

## 2014-09-10 ENCOUNTER — Inpatient Hospital Stay: Payer: Medicare Other | Admitting: Oncology

## 2014-09-10 VITALS — BP 114/74 | HR 96 | Temp 97.4°F | Resp 20 | Wt 192.9 lb

## 2014-09-10 DIAGNOSIS — R531 Weakness: Secondary | ICD-10-CM | POA: Insufficient documentation

## 2014-09-10 DIAGNOSIS — R110 Nausea: Secondary | ICD-10-CM | POA: Diagnosis not present

## 2014-09-10 DIAGNOSIS — R509 Fever, unspecified: Secondary | ICD-10-CM | POA: Diagnosis not present

## 2014-09-10 DIAGNOSIS — M129 Arthropathy, unspecified: Secondary | ICD-10-CM | POA: Diagnosis not present

## 2014-09-10 DIAGNOSIS — C801 Malignant (primary) neoplasm, unspecified: Secondary | ICD-10-CM

## 2014-09-10 DIAGNOSIS — Z79899 Other long term (current) drug therapy: Secondary | ICD-10-CM | POA: Insufficient documentation

## 2014-09-10 DIAGNOSIS — R109 Unspecified abdominal pain: Secondary | ICD-10-CM | POA: Diagnosis not present

## 2014-09-10 DIAGNOSIS — E786 Lipoprotein deficiency: Secondary | ICD-10-CM

## 2014-09-10 DIAGNOSIS — F17210 Nicotine dependence, cigarettes, uncomplicated: Secondary | ICD-10-CM

## 2014-09-10 DIAGNOSIS — R188 Other ascites: Secondary | ICD-10-CM | POA: Insufficient documentation

## 2014-09-10 DIAGNOSIS — C259 Malignant neoplasm of pancreas, unspecified: Secondary | ICD-10-CM | POA: Insufficient documentation

## 2014-09-10 DIAGNOSIS — Z9049 Acquired absence of other specified parts of digestive tract: Secondary | ICD-10-CM | POA: Insufficient documentation

## 2014-09-10 DIAGNOSIS — Z90710 Acquired absence of both cervix and uterus: Secondary | ICD-10-CM | POA: Insufficient documentation

## 2014-09-10 DIAGNOSIS — M549 Dorsalgia, unspecified: Secondary | ICD-10-CM | POA: Insufficient documentation

## 2014-09-10 DIAGNOSIS — M329 Systemic lupus erythematosus, unspecified: Secondary | ICD-10-CM | POA: Insufficient documentation

## 2014-09-10 DIAGNOSIS — R5383 Other fatigue: Secondary | ICD-10-CM

## 2014-09-10 DIAGNOSIS — Z9884 Bariatric surgery status: Secondary | ICD-10-CM | POA: Diagnosis not present

## 2014-09-10 DIAGNOSIS — Z8543 Personal history of malignant neoplasm of ovary: Secondary | ICD-10-CM | POA: Diagnosis not present

## 2014-09-10 DIAGNOSIS — R05 Cough: Secondary | ICD-10-CM | POA: Insufficient documentation

## 2014-09-10 DIAGNOSIS — E1165 Type 2 diabetes mellitus with hyperglycemia: Secondary | ICD-10-CM

## 2014-09-10 DIAGNOSIS — E876 Hypokalemia: Secondary | ICD-10-CM | POA: Diagnosis not present

## 2014-09-10 LAB — COMPREHENSIVE METABOLIC PANEL
ALBUMIN: 2.6 g/dL — AB (ref 3.5–5.0)
ALT: 29 U/L (ref 14–54)
AST: 46 U/L — AB (ref 15–41)
Alkaline Phosphatase: 378 U/L — ABNORMAL HIGH (ref 38–126)
Anion gap: 9 (ref 5–15)
BUN: 9 mg/dL (ref 6–20)
CHLORIDE: 99 mmol/L — AB (ref 101–111)
CO2: 27 mmol/L (ref 22–32)
CREATININE: 0.67 mg/dL (ref 0.44–1.00)
Calcium: 8.3 mg/dL — ABNORMAL LOW (ref 8.9–10.3)
GFR calc Af Amer: >60 mL/min (ref >60)
GFR calc non Af Amer: >60 mL/min (ref >60)
Glucose, Bld: 130 mg/dL — ABNORMAL HIGH (ref 65–99)
Potassium: 3 mmol/L — ABNORMAL LOW (ref 3.5–5.1)
Sodium: 135 mmol/L (ref 135–145)
Total Bilirubin: 1.2 mg/dL (ref 0.3–1.2)
Total Protein: 6.1 g/dL — ABNORMAL LOW (ref 6.5–8.1)

## 2014-09-10 LAB — CBC WITH DIFFERENTIAL/PLATELET
Basophils Absolute: 0 K/uL (ref 0–0.1)
Basophils Relative: 1 %
Eosinophils Absolute: 0.1 K/uL (ref 0–0.7)
Eosinophils Relative: 1 %
HCT: 32.6 % — ABNORMAL LOW (ref 35.0–47.0)
Hemoglobin: 10.9 g/dL — ABNORMAL LOW (ref 12.0–16.0)
Lymphocytes Relative: 14 %
Lymphs Abs: 0.6 K/uL — ABNORMAL LOW (ref 1.0–3.6)
MCH: 30.5 pg (ref 26.0–34.0)
MCHC: 33.5 g/dL (ref 32.0–36.0)
MCV: 90.8 fL (ref 80.0–100.0)
Monocytes Absolute: 0.5 K/uL (ref 0.2–0.9)
Monocytes Relative: 11 %
Neutro Abs: 3.1 K/uL (ref 1.4–6.5)
Neutrophils Relative %: 73 %
Platelets: 202 K/uL (ref 150–440)
RBC: 3.6 MIL/uL — ABNORMAL LOW (ref 3.80–5.20)
RDW: 18.9 % — ABNORMAL HIGH (ref 11.5–14.5)
WBC: 4.2 K/uL (ref 3.6–11.0)

## 2014-09-10 MED ORDER — CYCLOBENZAPRINE HCL 10 MG PO TABS: 10.0000 mg | ORAL_TABLET | Freq: Three times a day (TID) | ORAL | Status: DC

## 2014-09-10 MED ORDER — SODIUM CHLORIDE 0.9 % IJ SOLN
10.0000 mL | INTRAMUSCULAR | Status: DC | PRN
  Administered 2014-09-10: 10 mL via INTRAVENOUS
  Filled 2014-09-10: qty 10

## 2014-09-10 MED ORDER — LIDOCAINE-PRILOCAINE 2.5-2.5 % EX CREA: 1.0000 "application " | TOPICAL_CREAM | CUTANEOUS | Status: AC | PRN

## 2014-09-10 MED ORDER — HEPARIN SOD (PORK) LOCK FLUSH 100 UNIT/ML IV SOLN
500.0000 [IU] | Freq: Once | INTRAVENOUS | Status: AC
  Administered 2014-09-10: 500 [IU] via INTRAVENOUS
  Filled 2014-09-10: qty 5

## 2014-09-10 NOTE — Progress Notes (Signed)
Patient states she is retaining excess fluid in abdomen. Reports she feels better after having fluid drawn off. Has edema bilaterally 3+. Reports shortness of breath due to excess fluid.

## 2014-09-12 NOTE — Progress Notes (Signed)
Tiffany Gamble  Telephone:([phone]) [phone] [phone]) [phone]  ID: Tiffany Gamble OB: [birth year]  MR#: [id number]  ZNB#:[id number]  Patient Care Team: [name], MD as PCP - General (Family Medicine) Robert Bellow, MD (General Surgery) Lloyd Huger, MD as Consulting Physician (Oncology) Clent Jacks, RN as Registered Nurse  CHIEF COMPLAINT:  Chief Complaint  Patient presents with  . Follow-up    Pancreatic Cancer  . Chemotherapy    INTERVAL HISTORY: Patient returns to clinic today for further evaluation and consideration of cycle 3, day 1 of gemcitibine and abraxane.   Patient continues to have increasing ascites as well as peripheral edema. She has had at least one paracentesis since her last clinic visit. She has a poor appetite and continues to have weakness and fatigue.  She also continues to have abdominal pain and persistent nausea. She has no neurologic complaints. She denies any fevers. She has no chest pain or shortness of breath. She denies any vomiting, constipation, or diarrhea. She has no urinary complaints. Patient offers no further specific complaints.  REVIEW OF SYSTEMS:   Review of Systems  Constitutional: Positive for malaise/fatigue.  Respiratory: Positive for shortness of breath.   Gastrointestinal: Positive for nausea.  Neurological: Positive for weakness.    As per HPI. Otherwise, a complete review of systems is negatve.  PAST MEDICAL HISTORY: Past Medical History  Diagnosis Date  . Lupus   . Staph infection     MRSA  . Arthritis   . Cancer 2016    pancreas  . Ovarian cancer 2011    left    PAST SURGICAL HISTORY: Past Surgical History  Procedure Laterality Date  . Tonsillectomy    . Appendectomy    . Hand surgery Bilateral   . Knee surgery Right   . Partial hysterectomy    . Tubal ligation    . Ventral hernia repair  03-17-10, 11-24-10    Dr Bary Castilla  . Umbilical hernia repair  02-09-11    and ventral by Dr  Bary Castilla  . Laparoscopic lysis intestinal adhesions  03-09-10    Dr Bary Castilla  . Incision and drainage  09-17-10    with wound vac Dr Bary Castilla  . Abd wall hernia  09-07-10     Dr. Bary Castilla  . Hernia repair  2014    last surgery was Dr. Duke Salvia  . Gastric bypass      FAMILY HISTORY Family History  Problem Relation Age of Onset  . Cancer Mother     lymphoma  . Heart attack Father        ADVANCED DIRECTIVES:    HEALTH MAINTENANCE: History  Substance Use Topics  . Smoking status: Current Every Day Smoker -- 0.50 packs/day for 50 years    Types: Cigarettes  . Smokeless tobacco: Not on file  . Alcohol Use: No     Colonoscopy:  PAP:  Bone density:  Lipid panel:  Allergies  Allergen Reactions  . Acetaminophen Other (See Comments)    Other reaction(s): Other (qualifier value) Liver problems Affects liver.  . Clotrimazole     Other reaction(s): Unknown Pt. does not know  . Codeine Hives    Other reaction(s): Weal  . Fluticasone Furoate-Vilanterol Other (See Comments)    thrush  . Hydrocodone-Acetaminophen Itching    Other reaction(s): Itching of Skin, Weal itching  . Ibuprofen Other (See Comments)    alters liver function Other reaction(s): Other (qualifier value) liver problems Affects liver.  . Prednisone Other (See  Comments) and Swelling    Throat swelling  . Penicillin G Rash  . Penicillins Rash    Current Outpatient Prescriptions  Medication Sig Dispense Refill  . cyclobenzaprine (FLEXERIL) 10 MG tablet Take 1 tablet (10 mg total) by mouth every 8 (eight) hours. 30 tablet 3  . donepezil (ARICEPT) 5 MG tablet Take 5 mg by mouth at bedtime.   2  . fentaNYL (DURAGESIC - DOSED MCG/HR) 25 MCG/HR patch Place 25 mcg onto the skin every 3 (three) days.     . fluticasone (FLONASE) 50 MCG/ACT nasal spray Place 2 sprays into both nostrils daily.   11  . furosemide (LASIX) 40 MG tablet Take 1 tablet (40 mg total) by mouth daily. 30 tablet 2  . Oxycodone HCl 10 MG TABS Take  10 mg by mouth every 6 (six) hours as needed.  0  . pantoprazole (PROTONIX) 40 MG tablet Take 40 mg by mouth 2 (two) times daily.   5  . polyethylene glycol powder (GLYCOLAX/MIRALAX) powder Take by mouth 2 (two) times daily.   5  . prochlorperazine (COMPAZINE) 10 MG tablet Take 10 mg by mouth every 8 (eight) hours as needed.     Marland Kitchen SPIRIVA HANDIHALER 18 MCG inhalation capsule Place 18 mcg into inhaler and inhale daily.   3  . ursodiol (ACTIGALL) 300 MG capsule Take by mouth 4 (four) times daily.     . VENTOLIN HFA 108 (90 BASE) MCG/ACT inhaler Inhale 2 puffs into the lungs every 6 (six) hours as needed.   12  . lidocaine-prilocaine (EMLA) cream Apply 1 application topically as needed. Apply to port then cover with saran wrap 1-2 hours prior to chemotherapy 30 g 1  . Multiple Vitamin (MULTI-VITAMINS) TABS Take by mouth daily.      No current facility-administered medications for this visit.   Facility-Administered Medications Ordered in Other Visits  Medication Dose Route Frequency Provider Last Rate Last Dose  . sodium chloride 0.9 % injection 10 mL  10 mL Intravenous PRN Lloyd Huger, MD   10 mL at 09/10/14 1030    OBJECTIVE: Filed Vitals:   09/10/14 1123  BP: 114/74  Pulse: 96  Temp: 97.4 F (36.3 C)  Resp: 20     Body mass index is 33.76 kg/(m^2).    ECOG FS:2 - Symptomatic, <50% confined to bed  General: Well-developed, well-nourished, no acute distress. Eyes: anicteric sclera. Lungs: Clear to auscultation bilaterally. Heart: Regular rate and rhythm. No rubs, murmurs, or gallops. Abdomen: Soft, distended. No organomegaly noted, normoactive bowel sounds. Musculoskeletal: 2+ edema. Neuro: Alert, answering all questions appropriately. Cranial nerves grossly intact. Skin: No rashes or petechiae noted. Psych: Normal affect.    LAB RESULTS:  Lab Results  Component Value Date   NA 135 09/10/2014   K 3.0* 09/10/2014   CL 99* 09/10/2014   CO2 27 09/10/2014   GLUCOSE  130* 09/10/2014   BUN 9 09/10/2014   CREATININE 0.67 09/10/2014   CALCIUM 8.3* 09/10/2014   PROT 6.1* 09/10/2014   ALBUMIN 2.6* 09/10/2014   AST 46* 09/10/2014   ALT 29 09/10/2014   ALKPHOS 378* 09/10/2014   BILITOT 1.2 09/10/2014   GFRNONAA >60 09/10/2014   GFRAA >60 09/10/2014    Lab Results  Component Value Date   WBC 4.2 09/10/2014   NEUTROABS 3.1 09/10/2014   HGB 10.9* 09/10/2014   HCT 32.6* 09/10/2014   MCV 90.8 09/10/2014   PLT 202 09/10/2014     STUDIES: US Guided Paracentesis ([facility]  Hx)  08/28/2014   CLINICAL DATA:  Ascites and pancreatic lesion.  EXAM: ULTRASOUND GUIDED  PARACENTESIS  COMPARISON:  None.  PROCEDURE: An ultrasound guided paracentesis was thoroughly discussed with the patient and questions answered. The benefits, risks, alternatives and complications were also discussed. The patient understands and wishes to proceed with the procedure. Written consent was obtained.  Ultrasound was performed to localize and mark an adequate pocket of fluid in the right lower quadrant of the abdomen. The area was then prepped and draped in the normal sterile fashion. 1% Lidocaine was used for local anesthesia. Under ultrasound guidance a Safe-T-Centesis catheter was introduced. Paracentesis was performed. The catheter was removed and a dressing applied.  Complications: None.  FINDINGS: A total of approximately 2 L of yellow fluid was removed. A fluid sample was sent for laboratory analysis.  Estimated blood loss: Minimal  IMPRESSION: Successful ultrasound guided paracentesis yielding 2 L of ascites.   Electronically Signed   By: Markus Daft M.D.   On: 08/28/2014 15:18   [place] Attempted Paracentesis ([facility] Hx)  09/02/2014   CLINICAL DATA:  Pancreatic carcinoma, ascites status post 2 L paracentesis on 08/28/2014. The patient presents for additional paracentesis, if needed.  EXAM: LIMITED ABDOMEN ULTRASOUND FOR ASCITES  TECHNIQUE: Limited ultrasound survey for ascites was performed in all  four abdominal quadrants.  COMPARISON:  08/28/2014  FINDINGS: Survey of the peritoneal cavity shows a small amount of ascites around the liver and also a small amount in the left upper quadrant adjacent to the upper spleen. A trace amount of fluid is present in both lower quadrants. There was not enough remaining ascites to warrant paracentesis today.  IMPRESSION: Small amount of ascites in the peritoneal cavity. Paracentesis was not performed today.   Electronically Signed   By: Aletta Edouard M.D.   On: 09/02/2014 14:55    ASSESSMENT: Clinical stage III (T4, N0, M0) adenocarcinoma the pancreas.  PLAN:    1. Pancreatic cancer: Delay cycle 3, day 1 of gemcitabine and Abraxane today secondary to worsening ascites and declining performance status. We will get a CT of the abdomen and pelvis to further evaluate the etiology of her ascites. Return to clinic in 1 week for reconsideration of cycle 3, day 1. Patient is only able to receive treatments on days 1 and 15. She is not a surgical candidate, but XRT for local control also may be an option in the future.   2. Pain:  Continue current narcotic regimen. Patient also sees a pain specialist Dr. Humphrey Rolls. This clinic will continue prescribing her narcotics for the duration of chemotherapy. Once her chemotherapy has completed, she will return to Dr. Humphrey Rolls for continued treatment and evaluation. She has signed a narcotic contract. 3. Nausea: Continue Compazine as needed. 4. Thrombocytopenia: Resolved. Secondary to chemotherapy, monitor. 5. Hyperglycemia: Monitor closely while chemotherapy. Continue current diabetic regimen. 6. Back pain: MRI results reviewed independently with no evidence of metastatic disease. Continue current narcotic regimen. 6. Ascites: Unclear etiology. Ultrasound-guided paracentesis as above. Patient's EF is normal at 60%. Cytology was positive for malignant cells. CT scan as above. Continue current dose of Lasix.    Patient expressed  understanding and was in agreement with this plan. She also understands that She can call clinic at any time with any questions, concerns, or complaints.   Pancreatic cancer   Staging form: Pancreas, AJCC 7th Edition     Clinical stage from 08/24/2014: Stage III (T4, N0, M0) - Signed by Lloyd Huger, MD  on 08/24/2014   Lloyd Huger, MD   09/12/2014 9:08 AM

## 2014-09-14 ENCOUNTER — Other Ambulatory Visit: Payer: Self-pay | Admitting: Oncology

## 2014-09-16 ENCOUNTER — Ambulatory Visit
Admission: RE | Admit: 2014-09-16 | Discharge: 2014-09-16 | Disposition: A | Discharge status: Home / Self Care | Payer: Medicare Other | Source: Ambulatory Visit | Attending: Oncology | Admitting: Oncology

## 2014-09-16 DIAGNOSIS — R161 Splenomegaly, not elsewhere classified: Secondary | ICD-10-CM | POA: Diagnosis not present

## 2014-09-16 DIAGNOSIS — Z9889 Other specified postprocedural states: Secondary | ICD-10-CM | POA: Insufficient documentation

## 2014-09-16 DIAGNOSIS — C259 Malignant neoplasm of pancreas, unspecified: Secondary | ICD-10-CM | POA: Insufficient documentation

## 2014-09-16 MED ORDER — IOHEXOL 300 MG/ML  SOLN: 100.0000 mL | Freq: Once | INTRAMUSCULAR | Status: AC | PRN

## 2014-09-17 ENCOUNTER — Inpatient Hospital Stay: Payer: Medicare Other

## 2014-09-17 ENCOUNTER — Inpatient Hospital Stay (HOSPITAL_BASED_OUTPATIENT_CLINIC_OR_DEPARTMENT_OTHER): Payer: Medicare Other | Admitting: Oncology

## 2014-09-17 ENCOUNTER — Other Ambulatory Visit: Payer: Self-pay | Admitting: *Deleted

## 2014-09-17 ENCOUNTER — Other Ambulatory Visit: Payer: Self-pay | Admitting: Oncology

## 2014-09-17 VITALS — BP 112/72 | HR 94 | Temp 95.1°F | Resp 20 | Wt 181.7 lb

## 2014-09-17 DIAGNOSIS — F17210 Nicotine dependence, cigarettes, uncomplicated: Secondary | ICD-10-CM

## 2014-09-17 DIAGNOSIS — R188 Other ascites: Secondary | ICD-10-CM | POA: Diagnosis not present

## 2014-09-17 DIAGNOSIS — E786 Lipoprotein deficiency: Secondary | ICD-10-CM | POA: Diagnosis not present

## 2014-09-17 DIAGNOSIS — M129 Arthropathy, unspecified: Secondary | ICD-10-CM

## 2014-09-17 DIAGNOSIS — C259 Malignant neoplasm of pancreas, unspecified: Secondary | ICD-10-CM

## 2014-09-17 DIAGNOSIS — E1165 Type 2 diabetes mellitus with hyperglycemia: Secondary | ICD-10-CM | POA: Diagnosis not present

## 2014-09-17 DIAGNOSIS — R5383 Other fatigue: Secondary | ICD-10-CM

## 2014-09-17 DIAGNOSIS — R05 Cough: Secondary | ICD-10-CM

## 2014-09-17 DIAGNOSIS — Z8543 Personal history of malignant neoplasm of ovary: Secondary | ICD-10-CM

## 2014-09-17 DIAGNOSIS — Z9884 Bariatric surgery status: Secondary | ICD-10-CM

## 2014-09-17 DIAGNOSIS — R531 Weakness: Secondary | ICD-10-CM

## 2014-09-17 DIAGNOSIS — C251 Malignant neoplasm of body of pancreas: Secondary | ICD-10-CM

## 2014-09-17 DIAGNOSIS — Z79899 Other long term (current) drug therapy: Secondary | ICD-10-CM

## 2014-09-17 LAB — CBC WITH DIFFERENTIAL/PLATELET
Basophils Absolute: 0 10*3/uL (ref 0–0.1)
Basophils Relative: 1 %
EOS PCT: 1 %
Eosinophils Absolute: 0 10*3/uL (ref 0–0.7)
HCT: 36.1 % (ref 35.0–47.0)
Hemoglobin: 11.9 g/dL — ABNORMAL LOW (ref 12.0–16.0)
LYMPHS ABS: 0.6 10*3/uL — AB (ref 1.0–3.6)
LYMPHS PCT: 13 %
MCH: 30.4 pg (ref 26.0–34.0)
MCHC: 33 g/dL (ref 32.0–36.0)
MCV: 92.1 fL (ref 80.0–100.0)
MONO ABS: 0.4 10*3/uL (ref 0.2–0.9)
Monocytes Relative: 11 %
Neutro Abs: 3.1 10*3/uL (ref 1.4–6.5)
Neutrophils Relative %: 74 %
Platelets: 140 10*3/uL — ABNORMAL LOW (ref 150–440)
RBC: 3.91 MIL/uL (ref 3.80–5.20)
RDW: 18.6 % — AB (ref 11.5–14.5)
WBC: 4.1 10*3/uL (ref 3.6–11.0)

## 2014-09-17 LAB — COMPREHENSIVE METABOLIC PANEL
ALK PHOS: 475 U/L — AB (ref 38–126)
ALT: 33 U/L (ref 14–54)
AST: 56 U/L — ABNORMAL HIGH (ref 15–41)
Albumin: 2.7 g/dL — ABNORMAL LOW (ref 3.5–5.0)
Anion gap: 7 (ref 5–15)
BUN: 10 mg/dL (ref 6–20)
CO2: 27 mmol/L (ref 22–32)
Calcium: 8.2 mg/dL — ABNORMAL LOW (ref 8.9–10.3)
Chloride: 100 mmol/L — ABNORMAL LOW (ref 101–111)
Creatinine, Ser: 0.66 mg/dL (ref 0.44–1.00)
GFR calc Af Amer: >60 mL/min (ref >60)
GFR calc non Af Amer: >60 mL/min (ref >60)
Glucose, Bld: 132 mg/dL — ABNORMAL HIGH (ref 65–99)
POTASSIUM: 2.9 mmol/L — AB (ref 3.5–5.1)
SODIUM: 134 mmol/L — AB (ref 135–145)
TOTAL PROTEIN: 6.1 g/dL — AB (ref 6.5–8.1)
Total Bilirubin: 2.7 mg/dL — ABNORMAL HIGH (ref 0.3–1.2)

## 2014-09-17 MED ORDER — FENTANYL 50 MCG/HR TD PT72: 50.0000 ug | MEDICATED_PATCH | TRANSDERMAL | Status: DC

## 2014-09-17 MED ORDER — POTASSIUM CHLORIDE CRYS ER 20 MEQ PO TBCR: 40.0000 meq | EXTENDED_RELEASE_TABLET | Freq: Every day | ORAL | Status: DC

## 2014-09-17 MED ORDER — FENTANYL 50 MCG/HR TD PT72: 50.0000 ug | MEDICATED_PATCH | TRANSDERMAL | Status: AC

## 2014-09-17 MED ORDER — OXYCODONE HCL 20 MG PO TABS: 1.0000 | ORAL_TABLET | Freq: Four times a day (QID) | ORAL | Status: DC | PRN

## 2014-09-17 NOTE — Progress Notes (Signed)
[place]  [phone] [phone]) [phone]  ID: Erik Obey OB: Oct 23, 1945  MR#: [id number]  PIR#:[id number]  Patient Care Team: [name], MD as PCP - General (Family Medicine) Robert Bellow, MD (General Surgery) Lloyd Huger, MD as Consulting Physician (Oncology) Clent Jacks, RN as Registered Nurse  CHIEF COMPLAINT:  Chief Complaint  Patient presents with  . Follow-up    Pancreatic Cancer/discuss CT results    INTERVAL HISTORY: Patient returns to clinic today for further evaluation and discussion of her imaging results. Patient feels significantly better after not receiving treatment last week. She does not complain of any further ascites or peripheral edema. She continues to have significant pain. Her appetite and weakness and fatigue have improved. She has no neurologic complaints. She denies any fevers. She has no chest pain or shortness of breath. She denies any vomiting, constipation, or diarrhea. She has no urinary complaints. Patient offers no further specific complaints.  REVIEW OF SYSTEMS:   Review of Systems  Cardiovascular: Negative.   Gastrointestinal: Negative for nausea.  Neurological: Negative for weakness.    As per HPI. Otherwise, a complete review of systems is negatve.  PAST MEDICAL HISTORY: Past Medical History  Diagnosis Date  . Lupus   . Staph infection     MRSA  . Arthritis   . Cancer 2016    pancreas  . Ovarian cancer 2011    left    PAST SURGICAL HISTORY: Past Surgical History  Procedure Laterality Date  . Tonsillectomy    . Appendectomy    . Hand surgery Bilateral   . Knee surgery Right   . Partial hysterectomy    . Tubal ligation    . Ventral hernia repair  03-17-10, 11-24-10    Dr Bary Castilla  . Umbilical hernia repair  02-09-11    and ventral by Dr Bary Castilla  . Laparoscopic lysis intestinal adhesions  03-09-10    Dr Bary Castilla  . Incision and drainage  09-17-10    with wound vac Dr  Bary Castilla  . Abd wall hernia  09-07-10     Dr. Bary Castilla  . Hernia repair  2014    last surgery was Dr. Duke Salvia  . Gastric bypass      FAMILY HISTORY Family History  Problem Relation Age of Onset  . Cancer Mother     lymphoma  . Heart attack Father        ADVANCED DIRECTIVES:    HEALTH MAINTENANCE: History  Substance Use Topics  . Smoking status: Current Every Day Smoker -- 0.50 packs/day for 50 years    Types: Cigarettes  . Smokeless tobacco: Not on file  . Alcohol Use: No     Colonoscopy:  PAP:  Bone density:  Lipid panel:  Allergies  Allergen Reactions  . Acetaminophen Other (See Comments)    Other reaction(s): Other (qualifier value) Liver problems Affects liver.  . Clotrimazole     Other reaction(s): Unknown Pt. does not know  . Codeine Hives    Other reaction(s): Weal  . Fluticasone Furoate-Vilanterol Other (See Comments)    thrush  . Hydrocodone-Acetaminophen Itching    Other reaction(s): Itching of Skin, Weal itching  . Ibuprofen Other (See Comments)    alters liver function Other reaction(s): Other (qualifier value) liver problems Affects liver.  . Prednisone Other (See Comments) and Swelling    Throat swelling  . Penicillin G Rash  . Penicillins Rash    Current Outpatient Prescriptions  Medication Sig Dispense Refill  .  donepezil (ARICEPT) 5 MG tablet Take 5 mg by mouth at bedtime.   2  . fluticasone (FLONASE) 50 MCG/ACT nasal spray Place 2 sprays into both nostrils daily.   11  . furosemide (LASIX) 40 MG tablet Take 1 tablet (40 mg total) by mouth daily. 30 tablet 2  . lidocaine-prilocaine (EMLA) cream Apply 1 application topically as needed. Apply to port then cover with saran wrap 1-2 hours prior to chemotherapy 30 g 1  . Multiple Vitamin (MULTI-VITAMINS) TABS Take by mouth daily.     . pantoprazole (PROTONIX) 40 MG tablet Take 40 mg by mouth 2 (two) times daily.   5  . polyethylene glycol powder (GLYCOLAX/MIRALAX) powder Take by mouth 2 (two)  times daily.   5  . prochlorperazine (COMPAZINE) 10 MG tablet Take 10 mg by mouth every 8 (eight) hours as needed.     Marland Kitchen SPIRIVA HANDIHALER 18 MCG inhalation capsule Place 18 mcg into inhaler and inhale daily.   3  . ursodiol (ACTIGALL) 300 MG capsule Take by mouth 4 (four) times daily.     . VENTOLIN HFA 108 (90 BASE) MCG/ACT inhaler Inhale 2 puffs into the lungs every 6 (six) hours as needed.   12  . cyclobenzaprine (FLEXERIL) 10 MG tablet Take 1 tablet (10 mg total) by mouth every 8 (eight) hours. 30 tablet 3  . fentaNYL (DURAGESIC - DOSED MCG/HR) 50 MCG/HR Place 1 patch (50 mcg total) onto the skin every 3 (three) days. 10 patch 0  . Oxycodone HCl 20 MG TABS Take 1 tablet (20 mg total) by mouth 4 (four) times daily as needed. 180 tablet 0  . potassium chloride SA (K-DUR,KLOR-CON) 20 MEQ tablet Take 2 tablets (40 mEq total) by mouth daily. 30 tablet 3   No current facility-administered medications for this visit.   Facility-Administered Medications Ordered in Other Visits  Medication Dose Route Frequency Provider Last Rate Last Dose  . sodium chloride 0.9 % injection 10 mL  10 mL Intravenous PRN Lloyd Huger, MD   10 mL at 09/10/14 1030    OBJECTIVE: Filed Vitals:   09/17/14 0943  BP: 112/72  Pulse: 94  Temp: 95.1 F (35.1 C)  Resp: 20     Body mass index is 31.79 kg/(m^2).    ECOG FS:2 - Symptomatic, <50% confined to bed  General: Well-developed, well-nourished, no acute distress. Eyes: anicteric sclera. Lungs: Clear to auscultation bilaterally. Heart: Regular rate and rhythm. No rubs, murmurs, or gallops. Abdomen: Soft, mildly distended. No organomegaly noted, normoactive bowel sounds. Musculoskeletal: scant edema. Neuro: Alert, answering all questions appropriately. Cranial nerves grossly intact. Skin: No rashes or petechiae noted. Psych: Normal affect.    LAB RESULTS:  Lab Results  Component Value Date   NA 134* 09/17/2014   K 2.9* 09/17/2014   CL 100*  09/17/2014   CO2 27 09/17/2014   GLUCOSE 132* 09/17/2014   BUN 10 09/17/2014   CREATININE 0.66 09/17/2014   CALCIUM 8.2* 09/17/2014   PROT 6.1* 09/17/2014   ALBUMIN 2.7* 09/17/2014   AST 56* 09/17/2014   ALT 33 09/17/2014   ALKPHOS 475* 09/17/2014   BILITOT 2.7* 09/17/2014   GFRNONAA >60 09/17/2014   GFRAA >60 09/17/2014    Lab Results  Component Value Date   WBC 4.1 09/17/2014   NEUTROABS 3.1 09/17/2014   HGB 11.9* 09/17/2014   HCT 36.1 09/17/2014   MCV 92.1 09/17/2014   PLT 140* 09/17/2014     STUDIES: Ct Abdomen Pelvis W Contrast  09/16/2014   CLINICAL DATA:  Restaging pancreatic cancer  EXAM: CT ABDOMEN AND PELVIS WITH CONTRAST  TECHNIQUE: Multidetector CT imaging of the abdomen and pelvis was performed using the standard protocol following bolus administration of intravenous contrast.  CONTRAST:  100 mL Omnipaque 300 IV  COMPARISON:  06/10/2014  FINDINGS: Lower chest:  Mild scarring in the lingula/left lower lobe.  Hepatobiliary: Cirrhotic configuration of the liver. No suspicious/enhancing hepatic lesions.  Status post cholecystectomy. Moderate intrahepatic and extrahepatic ductal dilatation, increased.  Pancreas: 3.8 x 2.8 cm hypoenhancing mass along the medial aspect of the pancreatic head/uncinate process (series 2/ image 35), involving/encasing the SMA and SMV, previously 3.7 x 3.1 cm. Associated dilatation of the main pancreatic duct.  Spleen: Splenomegaly, measuring 19.1 cm.  Adrenals/Urinary Tract: Adrenal glands are within normal limits.  Kidneys within normal limits.  No hydronephrosis.  Bladder is within normal limits.  Stomach/Bowel: Prior gastric surgery with gastrojejunostomy.  No evidence of bowel obstruction.  Vascular/Lymphatic: Atherosclerotic calcifications of the abdominal aorta and branch vessels.  Portal vein is patent. Tumor involves the undersurface of the portal vein/portosplenic confluence (coronal image 73). Tumor encases the proximal aspect of the SMA  (series 2/image 33).  Upper abdominal/retroperitoneal lymphadenopathy, including:  --12 mm short axis right juxta diaphragmatic node (series 2/image 7), previously 13 mm  --15 mm short axis gastrohepatic node (series 2/ image 22), previously 16 mm  --12 mm short axis aortocaval node (series 2/ image 35), previously 17 mm  Reproductive: Status post hysterectomy.  No adnexal masses.  Other: Moderate abdominopelvic ascites.  Associated mild peritoneal thickening/nodularity in the pelvis (series 2/ image 72), worrisome for peritoneal disease.  Postsurgical changes related to prior ventral hernia mesh repair.  Musculoskeletal: Degenerative changes of the visualized thoracolumbar spine.  IMPRESSION: 3.8 cm hypoenhancing mass along the medial aspect of the pancreatic head/uncinate process, corresponding to known primary pancreatic neoplasm, grossly unchanged.  Associated vascular involvement, as described above.  Upper abdominal lymphadenopathy, mildly decreased.  Moderate abdominal ascites with associated peritoneal thickening/ nodularity, worrisome for peritoneal disease.  Additional ancillary findings as above.   Electronically Signed   By: Julian Hy M.D.   On: 09/16/2014 12:49   US Guided Paracentesis ([facility] Hx)  08/28/2014   CLINICAL DATA:  Ascites and pancreatic lesion.  EXAM: ULTRASOUND GUIDED  PARACENTESIS  COMPARISON:  None.  PROCEDURE: An ultrasound guided paracentesis was thoroughly discussed with the patient and questions answered. The benefits, risks, alternatives and complications were also discussed. The patient understands and wishes to proceed with the procedure. Written consent was obtained.  Ultrasound was performed to localize and mark an adequate pocket of fluid in the right lower quadrant of the abdomen. The area was then prepped and draped in the normal sterile fashion. 1% Lidocaine was used for local anesthesia. Under ultrasound guidance a Safe-T-Centesis catheter was introduced.  Paracentesis was performed. The catheter was removed and a dressing applied.  Complications: None.  FINDINGS: A total of approximately 2 L of yellow fluid was removed. A fluid sample was sent for laboratory analysis.  Estimated blood loss: Minimal  IMPRESSION: Successful ultrasound guided paracentesis yielding 2 L of ascites.   Electronically Signed   By: Markus Daft M.D.   On: 08/28/2014 15:18   [place] Attempted Paracentesis ([facility] Hx)  09/02/2014   CLINICAL DATA:  Pancreatic carcinoma, ascites status post 2 L paracentesis on 08/28/2014. The patient presents for additional paracentesis, if needed.  EXAM: LIMITED ABDOMEN ULTRASOUND FOR ASCITES  TECHNIQUE: Limited ultrasound survey for  ascites was performed in all four abdominal quadrants.  COMPARISON:  08/28/2014  FINDINGS: Survey of the peritoneal cavity shows a small amount of ascites around the liver and also a small amount in the left upper quadrant adjacent to the upper spleen. A trace amount of fluid is present in both lower quadrants. There was not enough remaining ascites to warrant paracentesis today.  IMPRESSION: Small amount of ascites in the peritoneal cavity. Paracentesis was not performed today.   Electronically Signed   By: Aletta Edouard M.D.   On: 09/02/2014 14:55    ASSESSMENT: Clinical stage III (T4, N0, M0) adenocarcinoma the pancreas.  PLAN:    1. Pancreatic cancer: CT scan results independently reviewed and reported as above with essentially stable disease. After lengthy discussion, patient has decided to discontinue chemotherapy at this time. We will cancel all further treatments. Patient will return to clinic in 1 month with repeat laboratory work and further evaluation. Patient states at this point she may consider reinitiating chemotherapy. She is not ready to enroll in hospice.    2. Pain:  Increase fentanyl patch to 50 g every 2 days and continue 20 mg oxycodone every 6 hours as needed. Patient also sees a pain specialist Dr. Humphrey Rolls.  This clinic will continue prescribing her narcotics for the duration of chemotherapy. Once her chemotherapy has completed, she will return to Dr. Humphrey Rolls for continued treatment and evaluation. She has signed a narcotic contract. 3. Nausea: Continue Compazine as needed. 4. Thrombocytopenia: Resolved. Secondary to chemotherapy, monitor. 5. Hyperglycemia: Monitor closely while chemotherapy. Continue current diabetic regimen. 6. Back pain: MRI results reviewed independently with no evidence of metastatic disease. Continue current narcotic regimen. 7. Ascites/edmea: Likely secondary to pancreatic cancer. Improved, monitor. Continue Lasix as prescribed. 8. Hypokalemia: Patient did not wish to have an infusion today, therefore was called in oral supplementation.   Patient expressed understanding and was in agreement with this plan. She also understands that She can call clinic at any time with any questions, concerns, or complaints.   Pancreatic cancer   Staging form: Pancreas, AJCC 7th Edition     Clinical stage from 08/24/2014: Stage III (T4, N0, M0) - Signed by Lloyd Huger, MD on 08/24/2014   Lloyd Huger, MD   09/17/2014 1:34 PM

## 2014-09-23 ENCOUNTER — Telehealth: Payer: Self-pay

## 2014-09-23 ENCOUNTER — Other Ambulatory Visit: Payer: Self-pay | Admitting: *Deleted

## 2014-09-23 MED ORDER — CYCLOBENZAPRINE HCL 10 MG PO TABS
10.0000 mg | ORAL_TABLET | Freq: Two times a day (BID) | ORAL | Status: DC
Start: 2014-09-23 — End: 2014-10-15

## 2014-09-23 NOTE — Telephone Encounter (Signed)
Oncology Nurse Navigator Documentation  Oncology Nurse Navigator Flowsheets 09/23/2014  Navigator Encounter Type Telephone  Interventions Medication assitance  Time Spent with Patient 15

## 2014-09-28 ENCOUNTER — Encounter: Payer: Self-pay | Admitting: Emergency Medicine

## 2014-09-28 ENCOUNTER — Inpatient Hospital Stay
Admission: EM | Admit: 2014-09-28 | Discharge: 2014-10-04 | DRG: 409 | Disposition: A | Discharge status: Home / Self Care | Payer: Medicare Other | Attending: Internal Medicine | Admitting: Internal Medicine

## 2014-09-28 ENCOUNTER — Emergency Department: Payer: Medicare Other

## 2014-09-28 DIAGNOSIS — F17210 Nicotine dependence, cigarettes, uncomplicated: Secondary | ICD-10-CM | POA: Diagnosis present

## 2014-09-28 DIAGNOSIS — G8929 Other chronic pain: Secondary | ICD-10-CM | POA: Diagnosis present

## 2014-09-28 DIAGNOSIS — Z8543 Personal history of malignant neoplasm of ovary: Secondary | ICD-10-CM

## 2014-09-28 DIAGNOSIS — E876 Hypokalemia: Secondary | ICD-10-CM | POA: Diagnosis present

## 2014-09-28 DIAGNOSIS — R55 Syncope and collapse: Secondary | ICD-10-CM | POA: Diagnosis present

## 2014-09-28 DIAGNOSIS — C259 Malignant neoplasm of pancreas, unspecified: Secondary | ICD-10-CM | POA: Diagnosis not present

## 2014-09-28 DIAGNOSIS — R17 Unspecified jaundice: Secondary | ICD-10-CM | POA: Insufficient documentation

## 2014-09-28 DIAGNOSIS — Z9221 Personal history of antineoplastic chemotherapy: Secondary | ICD-10-CM

## 2014-09-28 DIAGNOSIS — Z8249 Family history of ischemic heart disease and other diseases of the circulatory system: Secondary | ICD-10-CM | POA: Diagnosis not present

## 2014-09-28 DIAGNOSIS — Z8614 Personal history of Methicillin resistant Staphylococcus aureus infection: Secondary | ICD-10-CM

## 2014-09-28 DIAGNOSIS — R188 Other ascites: Secondary | ICD-10-CM | POA: Diagnosis present

## 2014-09-28 DIAGNOSIS — C799 Secondary malignant neoplasm of unspecified site: Secondary | ICD-10-CM | POA: Diagnosis not present

## 2014-09-28 DIAGNOSIS — K831 Obstruction of bile duct: Secondary | ICD-10-CM | POA: Diagnosis present

## 2014-09-28 DIAGNOSIS — C250 Malignant neoplasm of head of pancreas: Secondary | ICD-10-CM | POA: Diagnosis present

## 2014-09-28 DIAGNOSIS — Z86010 Personal history of colonic polyps: Secondary | ICD-10-CM | POA: Diagnosis not present

## 2014-09-28 DIAGNOSIS — L299 Pruritus, unspecified: Secondary | ICD-10-CM | POA: Diagnosis present

## 2014-09-28 DIAGNOSIS — Z807 Family history of other malignant neoplasms of lymphoid, hematopoietic and related tissues: Secondary | ICD-10-CM | POA: Diagnosis not present

## 2014-09-28 DIAGNOSIS — J449 Chronic obstructive pulmonary disease, unspecified: Secondary | ICD-10-CM | POA: Diagnosis present

## 2014-09-28 DIAGNOSIS — Z9884 Bariatric surgery status: Secondary | ICD-10-CM

## 2014-09-28 DIAGNOSIS — D696 Thrombocytopenia, unspecified: Secondary | ICD-10-CM | POA: Diagnosis not present

## 2014-09-28 DIAGNOSIS — I10 Essential (primary) hypertension: Secondary | ICD-10-CM | POA: Diagnosis present

## 2014-09-28 DIAGNOSIS — M1990 Unspecified osteoarthritis, unspecified site: Secondary | ICD-10-CM | POA: Diagnosis present

## 2014-09-28 DIAGNOSIS — T85520A Displacement of bile duct prosthesis, initial encounter: Secondary | ICD-10-CM

## 2014-09-28 DIAGNOSIS — K219 Gastro-esophageal reflux disease without esophagitis: Secondary | ICD-10-CM | POA: Diagnosis present

## 2014-09-28 LAB — COMPREHENSIVE METABOLIC PANEL
ALBUMIN: 2.4 g/dL — AB (ref 3.5–5.0)
ALT: 45 U/L (ref 14–54)
ANION GAP: 9 (ref 5–15)
AST: 66 U/L — ABNORMAL HIGH (ref 15–41)
Alkaline Phosphatase: 438 U/L — ABNORMAL HIGH (ref 38–126)
BUN: 7 mg/dL (ref 6–20)
CO2: 23 mmol/L (ref 22–32)
CREATININE: 0.46 mg/dL (ref 0.44–1.00)
Calcium: 8.1 mg/dL — ABNORMAL LOW (ref 8.9–10.3)
Chloride: 100 mmol/L — ABNORMAL LOW (ref 101–111)
GFR calc Af Amer: >60 mL/min (ref >60)
Glucose, Bld: 100 mg/dL — ABNORMAL HIGH (ref 65–99)
POTASSIUM: 2.9 mmol/L — AB (ref 3.5–5.1)
Sodium: 132 mmol/L — ABNORMAL LOW (ref 135–145)
TOTAL PROTEIN: 6.1 g/dL — AB (ref 6.5–8.1)
Total Bilirubin: 10.1 mg/dL — ABNORMAL HIGH (ref 0.3–1.2)

## 2014-09-28 LAB — URINALYSIS COMPLETE WITH MICROSCOPIC (ARMC ONLY)
Glucose, UA: NEGATIVE mg/dL
Hgb urine dipstick: NEGATIVE
Ketones, ur: NEGATIVE mg/dL
LEUKOCYTES UA: NEGATIVE
Nitrite: NEGATIVE
PROTEIN: NEGATIVE mg/dL
Specific Gravity, Urine: 1.012 (ref 1.005–1.030)
pH: 6 (ref 5.0–8.0)

## 2014-09-28 LAB — CBC
HEMATOCRIT: 34.4 % — AB (ref 35.0–47.0)
Hemoglobin: 11.5 g/dL — ABNORMAL LOW (ref 12.0–16.0)
MCH: 31.2 pg (ref 26.0–34.0)
MCHC: 33.4 g/dL (ref 32.0–36.0)
MCV: 93.4 fL (ref 80.0–100.0)
Platelets: 116 10*3/uL — ABNORMAL LOW (ref 150–440)
RBC: 3.68 MIL/uL — ABNORMAL LOW (ref 3.80–5.20)
RDW: 16.2 % — AB (ref 11.5–14.5)
WBC: 5.7 10*3/uL (ref 3.6–11.0)

## 2014-09-28 LAB — CK TOTAL AND CKMB (NOT AT ARMC)
CK TOTAL: 238 U/L — AB (ref 38–234)
CK, MB: 1.3 ng/mL (ref 0.5–5.0)
RELATIVE INDEX: 0.5 (ref 0.0–2.5)

## 2014-09-28 LAB — TROPONIN I

## 2014-09-28 LAB — AMMONIA: Ammonia: 9 umol/L (ref 9–35)

## 2014-09-28 MED ORDER — POTASSIUM CHLORIDE 20 MEQ/15ML (10%) PO SOLN
40.0000 meq | Freq: Once | ORAL | Status: AC
  Administered 2014-09-28: 40 meq via ORAL
  Filled 2014-09-28: qty 30

## 2014-09-28 MED ORDER — PANTOPRAZOLE SODIUM 40 MG PO TBEC
40.0000 mg | DELAYED_RELEASE_TABLET | Freq: Two times a day (BID) | ORAL | Status: DC
  Administered 2014-09-28 – 2014-10-04 (×11): 40 mg via ORAL
  Filled 2014-09-28 (×11): qty 1

## 2014-09-28 MED ORDER — DONEPEZIL HCL 5 MG PO TABS
5.0000 mg | ORAL_TABLET | Freq: Every day | ORAL | Status: DC
  Administered 2014-09-28 – 2014-10-03 (×6): 5 mg via ORAL
  Filled 2014-09-28 (×6): qty 1

## 2014-09-28 MED ORDER — SODIUM CHLORIDE 0.9 % IV SOLN
1000.0000 mL | INTRAVENOUS | Status: DC
  Administered 2014-09-28: 1000 mL via INTRAVENOUS

## 2014-09-28 MED ORDER — FENTANYL CITRATE (PF) 100 MCG/2ML IJ SOLN
50.0000 ug | Freq: Once | INTRAMUSCULAR | Status: AC
  Administered 2014-09-28: 50 ug via INTRAVENOUS

## 2014-09-28 MED ORDER — ALBUTEROL SULFATE (2.5 MG/3ML) 0.083% IN NEBU
2.5000 mg | INHALATION_SOLUTION | Freq: Once | RESPIRATORY_TRACT | Status: AC
  Administered 2014-09-28: 2.5 mg via RESPIRATORY_TRACT

## 2014-09-28 MED ORDER — FENTANYL CITRATE (PF) 100 MCG/2ML IJ SOLN
INTRAMUSCULAR | Status: AC
  Administered 2014-09-28: 50 ug via INTRAVENOUS
  Filled 2014-09-28: qty 2

## 2014-09-28 MED ORDER — HYDROMORPHONE HCL 1 MG/ML IJ SOLN
1.0000 mg | Freq: Once | INTRAMUSCULAR | Status: AC
Start: 2014-09-28 — End: 2014-09-28
  Administered 2014-09-28: 1 mg via INTRAVENOUS

## 2014-09-28 MED ORDER — HYDROMORPHONE HCL 1 MG/ML IJ SOLN
INTRAMUSCULAR | Status: AC
  Administered 2014-09-28: 1 mg via INTRAVENOUS
  Filled 2014-09-28: qty 1

## 2014-09-28 MED ORDER — SODIUM CHLORIDE 0.9 % IV SOLN
1000.0000 mL | Freq: Once | INTRAVENOUS | Status: AC
  Administered 2014-09-28: 1000 mL via INTRAVENOUS

## 2014-09-28 MED ORDER — FLUTICASONE PROPIONATE 50 MCG/ACT NA SUSP
2.0000 | Freq: Every day | NASAL | Status: DC
  Administered 2014-09-29 – 2014-10-04 (×6): 2 via NASAL
  Filled 2014-09-28: qty 16

## 2014-09-28 MED ORDER — CYCLOBENZAPRINE HCL 10 MG PO TABS
10.0000 mg | ORAL_TABLET | Freq: Two times a day (BID) | ORAL | Status: DC
  Administered 2014-09-28 – 2014-10-04 (×12): 10 mg via ORAL
  Filled 2014-09-28 (×12): qty 1

## 2014-09-28 MED ORDER — OXYCODONE HCL 5 MG PO TABS
20.0000 mg | ORAL_TABLET | Freq: Four times a day (QID) | ORAL | Status: DC | PRN
  Administered 2014-09-28 – 2014-10-04 (×11): 20 mg via ORAL
  Filled 2014-09-28 (×13): qty 4

## 2014-09-28 MED ORDER — ALBUTEROL SULFATE (2.5 MG/3ML) 0.083% IN NEBU
INHALATION_SOLUTION | RESPIRATORY_TRACT | Status: AC
  Administered 2014-09-28: 2.5 mg via RESPIRATORY_TRACT
  Filled 2014-09-28: qty 3

## 2014-09-28 MED ORDER — ONDANSETRON HCL 4 MG PO TABS: 4.0000 mg | ORAL_TABLET | Freq: Four times a day (QID) | ORAL | Status: DC | PRN

## 2014-09-28 MED ORDER — MORPHINE SULFATE 2 MG/ML IJ SOLN
2.0000 mg | INTRAMUSCULAR | Status: DC | PRN
  Administered 2014-09-29 – 2014-10-01 (×11): 2 mg via INTRAVENOUS
  Filled 2014-09-28 (×11): qty 1

## 2014-09-28 MED ORDER — ONDANSETRON HCL 4 MG/2ML IJ SOLN: 4.0000 mg | Freq: Four times a day (QID) | INTRAMUSCULAR | Status: DC | PRN

## 2014-09-28 MED ORDER — URSODIOL 300 MG PO CAPS
300.0000 mg | ORAL_CAPSULE | Freq: Every day | ORAL | Status: DC
Start: 2014-09-29 — End: 2014-10-02
  Administered 2014-09-29 – 2014-10-02 (×3): 300 mg via ORAL
  Filled 2014-09-28 (×4): qty 1

## 2014-09-28 MED ORDER — TIOTROPIUM BROMIDE MONOHYDRATE 18 MCG IN CAPS
18.0000 ug | ORAL_CAPSULE | Freq: Every day | RESPIRATORY_TRACT | Status: DC
  Administered 2014-09-29 – 2014-10-04 (×6): 18 ug via RESPIRATORY_TRACT
  Filled 2014-09-28 (×2): qty 5

## 2014-09-28 MED ORDER — POTASSIUM CHLORIDE IN NACL 20-0.9 MEQ/L-% IV SOLN
INTRAVENOUS | Status: DC
  Administered 2014-09-28 – 2014-09-30 (×5): via INTRAVENOUS
  Filled 2014-09-28 (×7): qty 1000

## 2014-09-28 MED ORDER — FENTANYL 50 MCG/HR TD PT72: 50.0000 ug | MEDICATED_PATCH | TRANSDERMAL | Status: DC

## 2014-09-28 NOTE — ED Notes (Signed)
Pt presents to ED c/o stroke-like symptoms since Friday afternoon around 2pm.  She and her husband report that she experienced a witnessed fall at home and since then has had mild left-sided facial asymmetry and drooling, left eye changes, and left leg weakness.  She is alert and oriented to self, situation, and location.  She also reports 10/10 bilateral leg pain that is constant. Pt is currently being treated for pancreatic cancer. No blood thinners.

## 2014-09-28 NOTE — ED Notes (Signed)
End of shift report given to Apolonio Schneiders, [place]

## 2014-09-28 NOTE — ED Notes (Signed)
Pt requests that port not be accessed. States that area is tender and painful.

## 2014-09-28 NOTE — ED Notes (Signed)
Patient transported to CT 

## 2014-09-28 NOTE — H&P (Signed)
[facility] [place] NAME: Tiffany Gamble    MR#:  [id number]  DATE OF BIRTH:  [birth year]   DATE OF ADMISSION:  09/28/2014  PRIMARY CARE PHYSICIAN: LADA, Satira Anis, MD   REQUESTING/REFERRING PHYSICIAN: Owens Shark  CHIEF COMPLAINT:   Chief Complaint  Patient presents with  . Stroke Symptoms   "passed out "  HISTORY OF PRESENT ILLNESS:  Tiffany Gamble  is a 69 y.o. female with a known history of pancreatic cancer, head of pancreas on active chemotherapy presenting after syncopal episode. This occurred 2 days prior to admission when she is going from a sitting to standing position. She denies any preceding symptoms, including chest pain, palpitations, shortness of breath, lightheadedness. She also attests having left-sided weakness which is been present ever since receiving chemotherapy. She has also noticed gradually progressive worsening of dark urine, light-colored stools, she was unaware of her jaundiced appearance. Emergency department course: Workup revealed marked hyperbilirubinemia  PAST MEDICAL HISTORY:   Past Medical History  Diagnosis Date  . Lupus   . Staph infection     MRSA  . Arthritis   . Cancer 2016    pancreas  . Ovarian cancer 2011    left    PAST SURGICAL HISTORY:   Past Surgical History  Procedure Laterality Date  . Tonsillectomy    . Appendectomy    . Hand surgery Bilateral   . Knee surgery Right   . Partial hysterectomy    . Tubal ligation    . Ventral hernia repair  03-17-10, 11-24-10    Dr Bary Castilla  . Umbilical hernia repair  02-09-11    and ventral by Dr Bary Castilla  . Laparoscopic lysis intestinal adhesions  03-09-10    Dr Bary Castilla  . Incision and drainage  09-17-10    with wound vac Dr Bary Castilla  . Abd wall hernia  09-07-10     Dr. Bary Castilla  . Hernia repair  2014    last surgery was Dr. Duke Salvia  . Gastric bypass      SOCIAL HISTORY:   History  Substance Use Topics  . Smoking status: Current  Every Day Smoker -- 1.00 packs/day for 50 years    Types: Cigarettes  . Smokeless tobacco: Never Used  . Alcohol Use: No    FAMILY HISTORY:   Family History  Problem Relation Age of Onset  . Cancer Mother     lymphoma  . Heart attack Father     DRUG ALLERGIES:   Allergies  Allergen Reactions  . Acetaminophen Other (See Comments)    Other reaction(s): Other (qualifier value) Liver problems Affects liver.  . Clotrimazole     Other reaction(s): Unknown Pt. does not know  . Codeine Hives    Other reaction(s): Weal  . Fluticasone Furoate-Vilanterol Other (See Comments)    thrush  . Hydrocodone-Acetaminophen Itching    Other reaction(s): Itching of Skin, Weal itching  . Ibuprofen Other (See Comments)    alters liver function Other reaction(s): Other (qualifier value) liver problems Affects liver.  . Prednisone Other (See Comments) and Swelling    Throat swelling  . Penicillin G Rash  . Penicillins Rash    REVIEW OF SYSTEMS:  REVIEW OF SYSTEMS:  CONSTITUTIONAL: Denies fevers, chills, positive for fatigue, weakness.  EYES: Denies blurred vision, double vision, or eye pain.  EARS, NOSE, THROAT: Denies tinnitus, ear pain, hearing loss.  RESPIRATORY: denies cough, shortness of breath, wheezing  CARDIOVASCULAR: Denies chest pain, palpitations,  edema. Positive for syncope GASTROINTESTINAL: Denies nausea, vomiting, diarrhea, abdominal pain.  GENITOURINARY: Denies dysuria, hematuria.  ENDOCRINE: Denies nocturia or thyroid problems. HEMATOLOGIC AND LYMPHATIC: Denies easy bruising or bleeding.  SKIN: Denies rash or lesions.  MUSCULOSKELETAL: Denies pain in neck, back, shoulder, knees, hips, or further arthritic symptoms.  NEUROLOGIC: Denies paralysis, paresthesias.  PSYCHIATRIC: Denies anxiety or depressive symptoms. Otherwise full review of systems performed by me is negative.   MEDICATIONS AT HOME:   Prior to Admission medications   Medication Sig Start Date End Date  Taking? Authorizing Provider  cyclobenzaprine (FLEXERIL) 10 MG tablet Take 1 tablet (10 mg total) by mouth 2 (two) times daily. 09/23/14  Yes Lloyd Huger, MD  donepezil (ARICEPT) 5 MG tablet Take 5 mg by mouth at bedtime.  06/19/14  Yes Historical Provider, MD  fentaNYL (DURAGESIC - DOSED MCG/HR) 50 MCG/HR Place 1 patch (50 mcg total) onto the skin every 3 (three) days. 09/17/14  Yes Lloyd Huger, MD  furosemide (LASIX) 40 MG tablet Take 1 tablet (40 mg total) by mouth daily. 09/08/14  Yes Lloyd Huger, MD  lidocaine-prilocaine (EMLA) cream Apply 1 application topically as needed. Apply to port then cover with saran wrap 1-2 hours prior to chemotherapy 09/10/14  Yes Lloyd Huger, MD  Multiple Vitamin (MULTI-VITAMINS) TABS Take by mouth daily.    Yes Historical Provider, MD  Oxycodone HCl 20 MG TABS Take 1 tablet (20 mg total) by mouth 4 (four) times daily as needed. 09/17/14  Yes Lloyd Huger, MD  pantoprazole (PROTONIX) 40 MG tablet Take 40 mg by mouth 2 (two) times daily.  07/05/14  Yes Historical Provider, MD  polyethylene glycol powder (GLYCOLAX/MIRALAX) powder Take by mouth 2 (two) times daily.  07/05/14  Yes Historical Provider, MD  potassium chloride SA (K-DUR,KLOR-CON) 20 MEQ tablet Take 2 tablets (40 mEq total) by mouth daily. 09/17/14  Yes Lloyd Huger, MD  prochlorperazine (COMPAZINE) 10 MG tablet Take 10 mg by mouth every 8 (eight) hours as needed.  07/16/14  Yes Historical Provider, MD  SPIRIVA HANDIHALER 18 MCG inhalation capsule Place 18 mcg into inhaler and inhale daily.  07/07/14  Yes Historical Provider, MD  ursodiol (ACTIGALL) 300 MG capsule Take by mouth 4 (four) times daily.    Yes Historical Provider, MD  VENTOLIN HFA 108 (90 BASE) MCG/ACT inhaler Inhale 2 puffs into the lungs every 6 (six) hours as needed.  07/01/14  Yes Historical Provider, MD  fluticasone (FLONASE) 50 MCG/ACT nasal spray Place 2 sprays into both nostrils daily.  07/05/14   Historical  Provider, MD      VITAL SIGNS:  Blood pressure 106/74, pulse 94, temperature 97.3 F (36.3 C), temperature source Oral, resp. rate 16, height 5\' 1"  (1.549 m), weight 184 lb (83.462 kg), SpO2 96 %.  PHYSICAL EXAMINATION:  VITAL SIGNS: Filed Vitals:   09/28/14 2250  BP: 106/74  Pulse: 94  Temp: 97.3 F (36.3 C)  Resp: 16   GENERAL:69 y.o.female currently in no acute distress. Markedly jaundice HEAD: Normocephalic, atraumatic.  EYES: Pupils equal, round, reactive to light. Extraocular muscles intact. Positive scleral icterus.  MOUTH: Dry mucosal membrane. Dentition intact. No abscess noted.  EAR, NOSE, THROAT: Clear without exudates. No external lesions.  NECK: Supple. No thyromegaly. No nodules. No JVD.  PULMONARY: Clear to ascultation, without wheeze rails or rhonci. No use of accessory muscles, Good respiratory effort. good air entry bilaterally CHEST: Nontender to palpation.  CARDIOVASCULAR: S1 and S2. Regular rate and  rhythm. No murmurs, rubs, or gallops. No edema. Pedal pulses 2+ bilaterally.  GASTROINTESTINAL: Soft, nontender, nondistended. No masses. Positive bowel sounds. No hepatosplenomegaly.  MUSCULOSKELETAL: No swelling, clubbing, or edema. Range of motion full in all extremities.  NEUROLOGIC: Cranial nerves II through XII are intact. No gross focal neurological deficits. Sensation intact. Reflexes intact.  SKIN: No ulceration, lesions, rashes, or cyanosis. Skin warm and dry. Turgor intact.  PSYCHIATRIC: Mood, affect within normal limits. The patient is awake, alert and oriented x 3. Insight, judgment intact.    LABORATORY PANEL:   CBC  Recent Labs Lab 09/28/14 1803  WBC 5.7  HGB 11.5*  HCT 34.4*  PLT 116*   ------------------------------------------------------------------------------------------------------------------  Chemistries   Recent Labs Lab 09/28/14 1803  NA 132*  K 2.9*  CL 100*  CO2 23  GLUCOSE 100*  BUN 7  CREATININE 0.46  CALCIUM  8.1*  AST 66*  ALT 45  ALKPHOS 438*  BILITOT 10.1*   ------------------------------------------------------------------------------------------------------------------  Cardiac Enzymes  Recent Labs Lab 09/28/14 1803  TROPONINI <0.03   ------------------------------------------------------------------------------------------------------------------  RADIOLOGY:  Ct Head Wo Contrast  09/28/2014   CLINICAL DATA:  LEFT facial droop for 1 day, syncope. History of pancreatic cancer, on chemotherapy. History of lupus and ovarian cancer.  EXAM: CT HEAD WITHOUT CONTRAST  TECHNIQUE: Contiguous axial images were obtained from the base of the skull through the vertex without intravenous contrast.  COMPARISON:  None.  FINDINGS: The ventricles and sulci are normal for age. No intraparenchymal hemorrhage, mass effect nor midline shift. Patchy supratentorial white matter hypodensities are less than expected patient's age and though non-specific suggest sequelae of chronic small vessel ischemic disease. No acute large vascular territory infarcts.  No abnormal extra-axial fluid collections. Basal cisterns are patent. Mild calcific atherosclerosis of the included vertebral arteries.  No skull fracture. The included ocular globes and orbital contents are non-suspicious. Bilateral ocular lens implants. The mastoid aircells and included paranasal sinuses are well-aerated. Patient is edentulous.  IMPRESSION: No acute intracranial process ; normal noncontrast CT of the head for age.   Electronically Signed   By: Elon Alas   On: 09/28/2014 18:25    EKG:   Orders placed or performed during the hospital encounter of 09/28/14  . EKG  . EKG    IMPRESSION AND PLAN:   69 year old African female history of pancreatic cancer, had a pancreas undergoing active chemotherapy presenting after single episode.  1. Syncope: Noted to have a low blood pressure on arrival to emergency department improvement after IV  fluid hydration, placed on telemetry, continue IV fluid hydration 2. Obstructive jaundice/hyperbilirubinemia: Has a known 3.8 cm head of pancreas mass, case discussed with gastroenterology in emergency department for ERCP and stent placement 3. Gastroesophageal reflux disease without esophagitis: Protonix 4. COPD, not in acute exacerbation: Continue Spiriva, Advair 5.Venous thromboembolism prophylactic: SCDs     All the records are reviewed and case discussed with ED provider. Management plans discussed with the patient, family and they are in agreement.  CODE STATUS: Full  TOTAL TIME TAKING CARE OF THIS PATIENT: 55 minutes.    Shay Jhaveri,  Karenann Cai.D on 09/28/2014 at 10:57 PM  Between 7am to 6pm - Pager - [phone]  After 6pm: House Pager: - [phone]  [facility]  Office  [phone]  CC: Primary care physician; LADA, Satira Anis, MD

## 2014-09-28 NOTE — ED Notes (Signed)
MD Owens Shark in to see patient.

## 2014-09-28 NOTE — ED Provider Notes (Signed)
[facility] Emergency Department Provider Note  ____________________________________________  Time seen: 5:00  I have reviewed the triage vital signs and the nursing notes.   HISTORY  Chief Complaint Stroke Symptoms      HPI Tiffany Gamble is a 69 y.o. female presents with history of syncopal episode on Friday at 2 PM. Patient stated she went from a seated to standing and then subsequently lost consciousness. She was found by her husband on the floor. She admits to multiple episodes of the same in the past. Of note patient has a history of pancreatic cancer for which she is treated at our [email or web address] [email or web address] . Last chemotherapy 3 weeks ago performed by Dr. Grayland Ormond. Patient denies any chest pain no headache no dizziness. Patient does admit to left arm and leg weakness status post fall.     Past Medical History  Diagnosis Date  . Lupus   . Staph infection     MRSA  . Arthritis   . Cancer 2016    pancreas  . Ovarian cancer 2011    left    Patient Active Problem List   Diagnosis Date Noted  . Pancreatic cancer 07/17/2014  . Cancer of pancreas, body 07/14/2014  . Amnesia 06/19/2014  . Mild cognitive disorder 06/19/2014  . Breathlessness on exertion 01/28/2014  . Chest pain 01/06/2014  . H/O disease 01/06/2014  . Anxiety 01/03/2014  . Carotid artery plaque 01/03/2014  . Cancer 01/03/2014  . CAFL (chronic airflow limitation) 01/03/2014  . Family history of allergic disorder 01/03/2014  . Acid reflux 01/03/2014  . 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 Cannot sleep 01/03/2014  . Adiposity 01/03/2014  . Addiction, opium 01/03/2014    Past Surgical History  Procedure Laterality Date  . Tonsillectomy    . Appendectomy    . Hand surgery Bilateral   . Knee surgery Right   . Partial hysterectomy    . Tubal ligation    . Ventral hernia repair  03-17-10, 11-24-10    Dr Bary Castilla  . Umbilical hernia repair  02-09-11    and ventral by Dr Bary Castilla  .  Laparoscopic lysis intestinal adhesions  03-09-10    Dr Bary Castilla  . Incision and drainage  09-17-10    with wound vac Dr Bary Castilla  . Abd wall hernia  09-07-10     Dr. Bary Castilla  . Hernia repair  2014    last surgery was Dr. Duke Salvia  . Gastric bypass      Current Outpatient Rx  Name  Route  Sig  Dispense  Refill  . cyclobenzaprine (FLEXERIL) 10 MG tablet   Oral   Take 1 tablet (10 mg total) by mouth 2 (two) times daily.   60 tablet   3   . donepezil (ARICEPT) 5 MG tablet   Oral   Take 5 mg by mouth at bedtime.       2   . fentaNYL (DURAGESIC - DOSED MCG/HR) 50 MCG/HR   Transdermal   Place 1 patch (50 mcg total) onto the skin every 3 (three) days.   10 patch   0   . fluticasone (FLONASE) 50 MCG/ACT nasal spray   Each Nare   Place 2 sprays into both nostrils daily.       11   . furosemide (LASIX) 40 MG tablet   Oral   Take 1 tablet (40 mg total) by mouth daily.   30 tablet   2   . lidocaine-prilocaine (EMLA) cream   Topical  Apply 1 application topically as needed. Apply to port then cover with saran wrap 1-2 hours prior to chemotherapy   30 g   1   . Multiple Vitamin (MULTI-VITAMINS) TABS   Oral   Take by mouth daily.          . Oxycodone HCl 20 MG TABS   Oral   Take 1 tablet (20 mg total) by mouth 4 (four) times daily as needed.   180 tablet   0   . pantoprazole (PROTONIX) 40 MG tablet   Oral   Take 40 mg by mouth 2 (two) times daily.       5   . polyethylene glycol powder (GLYCOLAX/MIRALAX) powder   Oral   Take by mouth 2 (two) times daily.       5   . potassium chloride SA (K-DUR,KLOR-CON) 20 MEQ tablet   Oral   Take 2 tablets (40 mEq total) by mouth daily.   30 tablet   3   . prochlorperazine (COMPAZINE) 10 MG tablet   Oral   Take 10 mg by mouth every 8 (eight) hours as needed.          Marland Kitchen SPIRIVA HANDIHALER 18 MCG inhalation capsule   Inhalation   Place 18 mcg into inhaler and inhale daily.       3     Dispense as written.   .  ursodiol (ACTIGALL) 300 MG capsule   Oral   Take by mouth 4 (four) times daily.          . VENTOLIN HFA 108 (90 BASE) MCG/ACT inhaler   Inhalation   Inhale 2 puffs into the lungs every 6 (six) hours as needed.       12     Dispense as written.     Allergies Acetaminophen; Clotrimazole; Codeine; Fluticasone furoate-vilanterol; Hydrocodone-acetaminophen; Ibuprofen; Prednisone; Penicillin g; and Penicillins  Family History  Problem Relation Age of Onset  . Cancer Mother     lymphoma  . Heart attack Father     Social History History  Substance Use Topics  . Smoking status: Current Every Day Smoker -- 1.00 packs/day for 50 years    Types: Cigarettes  . Smokeless tobacco: Never Used  . Alcohol Use: No    Review of Systems  Constitutional: Negative for fever. Eyes: Negative for visual changes. ENT: Negative for sore throat. Cardiovascular: Negative for chest pain. Respiratory: Negative for shortness of breath. Gastrointestinal: Negative for abdominal pain, vomiting and diarrhea. Genitourinary: Negative for dysuria. Musculoskeletal: Left arm leg weakness. Skin: Negative for rash. Neurological: Negative for headaches, focal weakness or numbness.   10-point ROS otherwise negative.  ____________________________________________   PHYSICAL EXAM:  VITAL SIGNS: ED Triage Vitals  Enc Vitals Group     BP 09/28/14 1640 117/68 mmHg     Pulse Rate 09/28/14 1640 97     Resp 09/28/14 1640 16     Temp 09/28/14 1640 98.1 F (36.7 C)     Temp Source 09/28/14 1640 Oral     SpO2 09/28/14 1640 98 %     Weight 09/28/14 1640 184 lb (83.462 kg)     Height 09/28/14 1640 5\' 1"  (1.549 m)     Head Cir --      Peak Flow --      Pain Score 09/28/14 1641 10     Pain Loc --      Pain Edu? --      Excl. in [place]? --  Constitutional: Alert and oriented. Well appearing and in no distress. Eyes: Conjunctivae are normal. PERRL. Normal extraocular movements. ENT   Head:  Normocephalic and atraumatic.   Nose: No congestion/rhinnorhea.   Mouth/Throat: Mucous membranes are moist.   Neck: No stridor. Cardiovascular: Normal rate, regular rhythm. Normal and symmetric distal pulses are present in all extremities. No murmurs, rubs, or gallops. Respiratory: Normal respiratory effort without tachypnea nor retractions. Breath sounds are clear and equal bilaterally. No wheezes/rales/rhonchi. Gastrointestinal: Soft and nontender. No distention. There is no CVA tenderness. Genitourinary: deferred Musculoskeletal: Nontender with normal range of motion in all extremities. No joint effusions.  No lower extremity tenderness nor edema. Neurologic:  Normal speech and language. No gross focal neurologic deficits are appreciated. Speech is normal.  Skin:  Skin is warm, dry and intact. No rash noted. Psychiatric: Mood and affect are normal. Speech and behavior are normal. Patient exhibits appropriate insight and judgment.  ____________________________________________    LABS (pertinent positives/negatives)  Labs Reviewed  CBC - Abnormal; Notable for the following:    RBC 3.68 (*)    Hemoglobin 11.5 (*)    HCT 34.4 (*)    RDW 16.2 (*)    Platelets 116 (*)    All other components within normal limits  COMPREHENSIVE METABOLIC PANEL - Abnormal; Notable for the following:    Sodium 132 (*)    Potassium 2.9 (*)    Chloride 100 (*)    Glucose, Bld 100 (*)    Calcium 8.1 (*)    Total Protein 6.1 (*)    Albumin 2.4 (*)    AST 66 (*)    Alkaline Phosphatase 438 (*)    Total Bilirubin 10.1 (*)    All other components within normal limits  CK TOTAL AND CKMB - Abnormal; Notable for the following:    Total CK 238 (*)    All other components within normal limits  TROPONIN I  AMMONIA  URINALYSIS COMPLETEWITH MICROSCOPIC ([facility])      ____________________________________________   EKG   Date: 09/28/2014  Rate: 129  Rhythm: Sinus tachycardia  QRS Axis:  normal  Intervals: normal  ST/T Wave abnormalities: normal  Conduction Disutrbances: none  Narrative Interpretation: unremarkable      ____________________________________________    RADIOLOGY  CT head negative.  ____________________________________________      INITIAL IMPRESSION / ASSESSMENT AND PLAN / ED COURSE  Pertinent labs & imaging results that were available during my care of the patient were reviewed by me and considered in my medical decision making (see chart for details).  History of physical exam, lab data consistent with hyperbilirubinemia, hypokalemia (40 mEq potassium chloride by mouth given). Regarding increase in bilirubin from 2.9 on May 11 210.1 at present will discuss admission with the hospitalist staff for possible stent placement. Patient discussed with Dr. Vira Agar who stated that he was in agreement with admission for possible stent placement which would be performed by either Dr. Cletus Gash Dr. Candace Cruise.  ____________________________________________   FINAL CLINICAL IMPRESSION(S) / ED DIAGNOSES  Final diagnoses:  Syncope, unspecified syncope type  Hypokalemia  Hyperbilirubinemia      Gregor Hams, MD 09/28/14 2131

## 2014-09-29 ENCOUNTER — Inpatient Hospital Stay: Payer: Medicare Other | Admitting: Anesthesiology

## 2014-09-29 ENCOUNTER — Encounter: Payer: Self-pay | Admitting: Anesthesiology

## 2014-09-29 ENCOUNTER — Encounter
Admission: EM | Disposition: A | Discharge status: Home / Self Care | Payer: Self-pay | Source: Home / Self Care | Attending: Internal Medicine

## 2014-09-29 HISTORY — PX: ERCP: SHX5425

## 2014-09-29 LAB — COMPREHENSIVE METABOLIC PANEL
ALBUMIN: 2 g/dL — AB (ref 3.5–5.0)
ALT: 40 U/L (ref 14–54)
ANION GAP: 5 (ref 5–15)
AST: 56 U/L — AB (ref 15–41)
Alkaline Phosphatase: 403 U/L — ABNORMAL HIGH (ref 38–126)
BUN: 8 mg/dL (ref 6–20)
CALCIUM: 7.7 mg/dL — AB (ref 8.9–10.3)
CO2: 20 mmol/L — ABNORMAL LOW (ref 22–32)
CREATININE: 0.33 mg/dL — AB (ref 0.44–1.00)
Chloride: 108 mmol/L (ref 101–111)
GFR calc Af Amer: >60 mL/min (ref >60)
GFR calc non Af Amer: >60 mL/min (ref >60)
Glucose, Bld: 88 mg/dL (ref 65–99)
POTASSIUM: 3.3 mmol/L — AB (ref 3.5–5.1)
Sodium: 133 mmol/L — ABNORMAL LOW (ref 135–145)
Total Bilirubin: 9.2 mg/dL — ABNORMAL HIGH (ref 0.3–1.2)
Total Protein: 5.1 g/dL — ABNORMAL LOW (ref 6.5–8.1)

## 2014-09-29 LAB — CBC
HEMATOCRIT: 31.2 % — AB (ref 35.0–47.0)
HEMOGLOBIN: 10.4 g/dL — AB (ref 12.0–16.0)
MCH: 31.3 pg (ref 26.0–34.0)
MCHC: 33.5 g/dL (ref 32.0–36.0)
MCV: 93.6 fL (ref 80.0–100.0)
Platelets: 104 10*3/uL — ABNORMAL LOW (ref 150–440)
RBC: 3.33 MIL/uL — ABNORMAL LOW (ref 3.80–5.20)
RDW: 16 % — AB (ref 11.5–14.5)
WBC: 5.2 10*3/uL (ref 3.6–11.0)

## 2014-09-29 SURGERY — ERCP, WITH INTERVENTION IF INDICATED
Anesthesia: Monitor Anesthesia Care

## 2014-09-29 MED ORDER — INDOMETHACIN 50 MG RE SUPP
100.0000 mg | Freq: Once | RECTAL | Status: AC
  Administered 2014-09-29: 100 mg via RECTAL
  Filled 2014-09-29 (×2): qty 2

## 2014-09-29 MED ORDER — LACTATED RINGERS IV SOLN
INTRAVENOUS | Status: DC | PRN
  Administered 2014-09-29: 14:00:00 via INTRAVENOUS

## 2014-09-29 MED ORDER — PROPOFOL 10 MG/ML IV BOLUS
INTRAVENOUS | Status: DC | PRN
  Administered 2014-09-29 (×8): 25 mg via INTRAVENOUS

## 2014-09-29 MED ORDER — BUDESONIDE-FORMOTEROL FUMARATE 160-4.5 MCG/ACT IN AERO
2.0000 | INHALATION_SPRAY | Freq: Two times a day (BID) | RESPIRATORY_TRACT | Status: DC
  Administered 2014-09-29 – 2014-10-04 (×9): 2 via RESPIRATORY_TRACT
  Filled 2014-09-29: qty 6

## 2014-09-29 MED ORDER — MORPHINE SULFATE 2 MG/ML IJ SOLN
1.0000 mg | Freq: Once | INTRAMUSCULAR | Status: AC
  Administered 2014-09-29: 1 mg via INTRAVENOUS
  Filled 2014-09-29: qty 1

## 2014-09-29 MED ORDER — NICOTINE 10 MG IN INHA
1.0000 | RESPIRATORY_TRACT | Status: DC | PRN
  Administered 2014-09-29 – 2014-09-30 (×3): 1 via RESPIRATORY_TRACT
  Filled 2014-09-29: qty 36

## 2014-09-29 MED ORDER — SODIUM CHLORIDE 0.9 % IV SOLN: INTRAVENOUS | Status: DC

## 2014-09-29 MED ORDER — INDOMETHACIN 50 MG RE SUPP
RECTAL | Status: DC | PRN
Start: 2014-09-29 — End: 2014-09-29
  Administered 2014-09-29: 100 mg via RECTAL

## 2014-09-29 MED ORDER — PROPOFOL INFUSION 10 MG/ML OPTIME
INTRAVENOUS | Status: DC | PRN
  Administered 2014-09-29: 100 ug/kg/min via INTRAVENOUS

## 2014-09-29 NOTE — Anesthesia Postprocedure Evaluation (Signed)
  Anesthesia Post-op Note  Patient: Tiffany Gamble  Procedure(s) Performed: Procedure(s): ENDOSCOPIC RETROGRADE CHOLANGIOPANCREATOGRAPHY (ERCP) (N/A)  Anesthesia type:No value filed.  Patient location: PACU  Post pain: Pain level controlled  Post assessment: Post-op Vital signs reviewed, Patient's Cardiovascular Status Stable, Respiratory Function Stable, Patent Airway and No signs of Nausea or vomiting  Post vital signs: Reviewed and stable  Last Vitals:  Filed Vitals:   09/29/14 1510  BP: 135/94  Pulse:   Temp:   Resp: 16    Level of consciousness: awake, alert  and patient cooperative  Complications: No apparent anesthesia complications

## 2014-09-29 NOTE — Transfer of Care (Signed)
Immediate Anesthesia Transfer of Care Note  Patient: Tiffany Gamble  Procedure(s) Performed: Procedure(s): ENDOSCOPIC RETROGRADE CHOLANGIOPANCREATOGRAPHY (ERCP) (N/A)  Patient Location: PACU  Anesthesia Type:General  Level of Consciousness: awake  Airway & Oxygen Therapy: Patient Spontanous Breathing  Post-op Assessment: Report given to RN  Post vital signs: stable  Last Vitals:  Filed Vitals:   09/29/14 1327  BP: 108/68  Pulse: 94  Temp: 35.8 C  Resp: 16    Complications: No apparent anesthesia complications

## 2014-09-29 NOTE — Progress Notes (Signed)
[place] [facility] [place] NAME: Tiffany Gamble    MR#:  [id number]  DATE OF BIRTH:  03/30/1946  SUBJECTIVE:  CHIEF COMPLAINT:   Chief Complaint  Patient presents with  . Stroke Symptoms   patient admitted for weakness and syncopal episode. However noted to have significantly elevated bilirubin from 2 last month to 10 now. Known pancreatic cancer status post 3-4 cycles of chemotherapy currently on hold. Complaints of feeling hungry and wants to get her ERCP done with, so she can eat.  REVIEW OF SYSTEMS:  Review of Systems  Constitutional: Positive for malaise/fatigue. Negative for fever and chills.  Eyes:       Icteric sclerae  Respiratory: Negative for cough, shortness of breath and wheezing.   Cardiovascular: Negative for chest pain and palpitations.  Gastrointestinal: Positive for nausea and abdominal pain. Negative for vomiting, diarrhea and constipation.       Abdominal distention, ascites.  Genitourinary: Negative for dysuria.  Musculoskeletal: Positive for back pain.       Chronic pain  Skin:       Jaundiced  Neurological: Positive for weakness. Negative for dizziness, seizures and headaches.    DRUG ALLERGIES:   Allergies  Allergen Reactions  . Acetaminophen Other (See Comments)    Other reaction(s): Other (qualifier value) Liver problems Affects liver.  . Clotrimazole     Other reaction(s): Unknown Pt. does not know  . Codeine Hives    Other reaction(s): Weal  . Fluticasone Furoate-Vilanterol Other (See Comments)    thrush  . Hydrocodone-Acetaminophen Itching    Other reaction(s): Itching of Skin, Weal itching  . Ibuprofen Other (See Comments)    alters liver function Other reaction(s): Other (qualifier value) liver problems Affects liver.  . Prednisone Other (See Comments) and Swelling    Throat swelling  . Penicillin G Rash  . Penicillins Rash    VITALS:  Blood pressure 120/72, pulse 98, temperature  96.6 F (35.9 C), temperature source Oral, resp. rate 22, height 5\' 1"  (1.549 m), weight 83.462 kg (184 lb), SpO2 100 %.  PHYSICAL EXAMINATION:  Physical Exam  Constitutional: She is oriented to person, place, and time. No distress.  HENT:  Head: Normocephalic and atraumatic.  Eyes: Conjunctivae are normal. Pupils are equal, round, and reactive to light. Scleral icterus is present.  Neck: Normal range of motion. Neck supple. No JVD present. No thyromegaly present.  Cardiovascular: Normal rate and regular rhythm.   Murmur heard. Pulmonary/Chest: No stridor. She is in respiratory distress. She has no wheezes. She has no rales. She exhibits no tenderness.  Abdominal: She exhibits distension. There is tenderness.  Ascites present. Hypoactive bowel sounds.  Musculoskeletal: She exhibits edema.  Neurological: She is alert and oriented to person, place, and time. No cranial nerve deficit. She exhibits normal muscle tone. GCS score is 15.  Skin: No rash noted. She is not diaphoretic. No erythema.  Psychiatric:  Irritable mood. Normal memory.     LABORATORY PANEL:   CBC  Recent Labs Lab 09/29/14 0431  WBC 5.2  HGB 10.4*  HCT 31.2*  PLT 104*   ------------------------------------------------------------------------------------------------------------------  Chemistries   Recent Labs Lab 09/29/14 0431  NA 133*  K 3.3*  CL 108  CO2 20*  GLUCOSE 88  BUN 8  CREATININE 0.33*  CALCIUM 7.7*  AST 56*  ALT 40  ALKPHOS 403*  BILITOT 9.2*   ------------------------------------------------------------------------------------------------------------------  Cardiac Enzymes  Recent Labs Lab 09/28/14 1803  TROPONINI <0.03   ------------------------------------------------------------------------------------------------------------------  RADIOLOGY:  Ct Head Wo Contrast  09/28/2014   CLINICAL DATA:  LEFT facial droop for 1 day, syncope. History of pancreatic cancer, on  chemotherapy. History of lupus and ovarian cancer.  EXAM: CT HEAD WITHOUT CONTRAST  TECHNIQUE: Contiguous axial images were obtained from the base of the skull through the vertex without intravenous contrast.  COMPARISON:  None.  FINDINGS: The ventricles and sulci are normal for age. No intraparenchymal hemorrhage, mass effect nor midline shift. Patchy supratentorial white matter hypodensities are less than expected patient's age and though non-specific suggest sequelae of chronic small vessel ischemic disease. No acute large vascular territory infarcts.  No abnormal extra-axial fluid collections. Basal cisterns are patent. Mild calcific atherosclerosis of the included vertebral arteries.  No skull fracture. The included ocular globes and orbital contents are non-suspicious. Bilateral ocular lens implants. The mastoid aircells and included paranasal sinuses are well-aerated. Patient is edentulous.  IMPRESSION: No acute intracranial process ; normal noncontrast CT of the head for age.   Electronically Signed   By: Elon Alas   On: 09/28/2014 18:25    EKG:   Orders placed or performed during the hospital encounter of 09/28/14  . EKG  . EKG    ASSESSMENT AND PLAN:   69 year old female with a past medical history significant for recent diagnosis of pancreatic cancer currently on chemotherapy, history of gastric bypass surgery, colonic polyps in the past, ovarian cancer status post surgical treatment, COPD and gastroesophageal reflux disease and ongoing ascites presents to the hospital secondary to weakness and a syncopal episode. She was noted to have worsening of her obstructive jaundice.  #1 obstructive jaundice-worsening. In light of her pancreatic cancer. GI has been consulted. ERCP for this afternoon. No imaging study done. Might be a complicated case for ERCP due to her gastric bypass surgery. Follow up labs in a.m. Also check lipase to ensure there is no post-ERCP pancreatitis.  #2  pancreatic cancer-follows with Dr. Maryjane Hurter. Discussed with Dr. Grayland Ormond today, he will see the patient tomorrow. Likely with worsening jaundice it could be worsening of her tumor. She did finish 3-4 cycles of chemotherapy and wanted to stop because of her side effects. They were planning to rediscuss about reinitiation of treatment versus hospice services next month.  #3 syncope-likely secondary to dehydration. Hold Lasix. Hydration for today. Referral with IV fluids because of her ascites. Breathing is stable at this time.  #4 COPD-not in any acute exacerbation. Does have scattered wheeze. Continue Spiriva and added Symbicort. No indication for systemic steroids.  #5 chronic pain-continue her pain medications home dose. She follows up with the pain management person and is on a pain contract.  #6 DVT prophylaxis-Ted's and SCDs.   All the records are reviewed and case discussed with Care Management/Social Workerr. Management plans discussed with the patient, family and they are in agreement.  CODE STATUS: Full code  TOTAL TIME TAKING CARE OF THIS PATIENT: 40 minutes.   POSSIBLE D/C IN 2-3 DAYS, DEPENDING ON CLINICAL CONDITION.   Gladstone Lighter M.D on 09/29/2014 at 3:07 PM  Between 7am to 6pm - Pager - [phone]  After 6pm go to [email or web address] - password [email or web address] [facility]  Office  [phone]  CC: Primary care physician; LADA, Satira Anis, MD

## 2014-09-29 NOTE — Op Note (Signed)
[facility] Patient Name: Varvara Legault Procedure Date: 09/29/2014 1:04 PM MRN: [id number] Account #: [id number] Date of Birth: [birth year] Admit Type: Inpatient Age: 69 Room: [facility] ENDO ROOM 4 Gender: Female Note Status: Finalized Procedure:         Upper GI endoscopy Indications:       Abnormal CT of the GI tract Providers:         Lucilla Lame, MD Referring MD:      Dwana Curd. Marzetta Board (Referring MD) Medicines:         Propofol per Anesthesia Complications:     No immediate complications. Procedure:         Pre-Anesthesia Assessment:                    - Prior to the procedure, a History and Physical was                     performed, and patient medications and allergies were                     reviewed. The patient's tolerance of previous anesthesia                     was also reviewed. The risks and benefits of the procedure                     and the sedation options and risks were discussed with the                     patient. All questions were answered, and informed consent                     was obtained. Prior Anticoagulants: The patient has taken                     no previous anticoagulant or antiplatelet agents. ASA                     Grade Assessment: II - A patient with mild systemic                     disease. After reviewing the risks and benefits, the                     patient was deemed in satisfactory condition to undergo                     the procedure.                    After obtaining informed consent, the endoscope was passed                     under direct vision. Throughout the procedure, the                     patient's blood pressure, pulse, and oxygen saturations                     were monitored continuously. The Olympus TJF-160VF                     duodenoscope (S#. [id number]) was introduced through the  mouth, and used to inject contrast into jejunum. The upper                     GI  endoscopy was accomplished without difficulty. The                     patient tolerated the procedure well. Findings:      The in the esophagus was normal.      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      Ampulla cound not be found. Impression:        - Normal.                    - A whipple procedure were found.                    - Ampulla cound not be found.                    - No specimens collected. Recommendation:    - PTC Procedure Code(s): --- Professional ---                    [id number], Esophagogastroduodenoscopy, flexible, transoral;                     diagnostic, including collection of specimen(s) by                     brushing or washing, when performed (separate procedure) Diagnosis Code(s): --- Professional ---                    R93.3, Abnormal findings on diagnostic imaging of other                     parts of digestive tract CPT copyright 2014 American Medical Association. All rights reserved. The codes documented in this report are preliminary and upon coder review may  be revised to meet current compliance requirements. Lucilla Lame, MD 09/29/2014 2:27:05 PM This report has been signed electronically. Number of Addenda: 0 Note Initiated On: 09/29/2014 1:04 PM      [facility]

## 2014-09-29 NOTE — Anesthesia Preprocedure Evaluation (Addendum)
Anesthesia Evaluation  Patient identified by MRN, date of birth, ID band Patient awake    Reviewed: Allergy & Precautions, NPO status , Patient's Chart, lab work & pertinent test results  Airway Mallampati: II  TM Distance: >3 FB     Dental  (+) Upper Dentures, Lower Dentures   Pulmonary COPD COPD inhaler, Current Smoker,          Cardiovascular + Peripheral Vascular Disease     Neuro/Psych Anxiety    GI/Hepatic   Endo/Other    Renal/GU      Musculoskeletal  (+) Arthritis -,   Abdominal   Peds  Hematology   Anesthesia Other Findings Pancreatic ca. Cognitive problems. Lupus.Ovarian ca.duragisic patch.  Reproductive/Obstetrics                            Anesthesia Physical Anesthesia Plan  ASA: III  Anesthesia Plan:    Post-op Pain Management:    Induction: Intravenous  Airway Management Planned: Nasal Cannula  Additional Equipment:   Intra-op Plan:   Post-operative Plan:   Informed Consent: I have reviewed the patients History and Physical, chart, labs and discussed the procedure including the risks, benefits and alternatives for the proposed anesthesia with the patient or authorized representative who has indicated his/her understanding and acceptance.     Plan Discussed with: CRNA  Anesthesia Plan Comments:         Anesthesia Quick Evaluation

## 2014-09-29 NOTE — OR Nursing (Signed)
Unable to locate ampulla with side-viewing ERCP scope. 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

## 2014-09-29 NOTE — Consult Note (Signed)
[facility]  [address] [place], [place] [address] Phone: [phone] Fax : [phone]  Consultation  Referring Provider:     No ref. provider found Primary Care Physician:  LADA, Satira Anis, MD Primary Gastroenterologist:  Dr. Allen Norris         Reason for Consultation:     Obstructive jaundice  Date of Admission:  09/28/2014 Date of Consultation:  09/29/2014         HPI:   Tiffany Gamble is a 69 y.o. female who comes in with increased jaundice from pancreatic cancer. The patient has a mass on the pancreas on her CT. The patient now has worsening of her intra-and extra biliary ductal dilatation. The patient had been taking chemotherapy for her pancreatic cancer but refuses any further chemotherapy due to the side effects of her chemotherapy.  Past Medical History  Diagnosis Date  . Lupus   . Staph infection     MRSA  . Arthritis   . Cancer 2016    pancreas  . Ovarian cancer 2011    left    Past Surgical History  Procedure Laterality Date  . Tonsillectomy    . Appendectomy    . Hand surgery Bilateral   . Knee surgery Right   . Partial hysterectomy    . Tubal ligation    . Ventral hernia repair  03-17-10, 11-24-10    Dr Bary Castilla  . Umbilical hernia repair  02-09-11    and ventral by Dr Bary Castilla  . Laparoscopic lysis intestinal adhesions  03-09-10    Dr Bary Castilla  . Incision and drainage  09-17-10    with wound vac Dr Bary Castilla  . Abd wall hernia  09-07-10     Dr. Bary Castilla  . Hernia repair  2014    last surgery was Dr. Duke Salvia  . Gastric bypass      Prior to Admission medications   Medication Sig Start Date End Date Taking? Authorizing Provider  cyclobenzaprine (FLEXERIL) 10 MG tablet Take 1 tablet (10 mg total) by mouth 2 (two) times daily. 09/23/14  Yes Lloyd Huger, MD  donepezil (ARICEPT) 5 MG tablet Take 5 mg by mouth at bedtime.  06/19/14  Yes Historical Provider, MD  fentaNYL (DURAGESIC - DOSED MCG/HR) 50 MCG/HR Place 1 patch (50 mcg total) onto  the skin every 3 (three) days. 09/17/14  Yes Lloyd Huger, MD  furosemide (LASIX) 40 MG tablet Take 1 tablet (40 mg total) by mouth daily. 09/08/14  Yes Lloyd Huger, MD  lidocaine-prilocaine (EMLA) cream Apply 1 application topically as needed. Apply to port then cover with saran wrap 1-2 hours prior to chemotherapy 09/10/14  Yes Lloyd Huger, MD  Multiple Vitamin (MULTI-VITAMINS) TABS Take by mouth daily.    Yes Historical Provider, MD  Oxycodone HCl 20 MG TABS Take 1 tablet (20 mg total) by mouth 4 (four) times daily as needed. 09/17/14  Yes Lloyd Huger, MD  pantoprazole (PROTONIX) 40 MG tablet Take 40 mg by mouth 2 (two) times daily.  07/05/14  Yes Historical Provider, MD  polyethylene glycol powder (GLYCOLAX/MIRALAX) powder Take by mouth 2 (two) times daily.  07/05/14  Yes Historical Provider, MD  potassium chloride SA (K-DUR,KLOR-CON) 20 MEQ tablet Take 2 tablets (40 mEq total) by mouth daily. 09/17/14  Yes Lloyd Huger, MD  prochlorperazine (COMPAZINE) 10 MG tablet Take 10 mg by mouth every 8 (eight) hours as needed.  07/16/14  Yes Historical Provider, MD  SPIRIVA HANDIHALER 18  MCG inhalation capsule Place 18 mcg into inhaler and inhale daily.  07/07/14  Yes Historical Provider, MD  ursodiol (ACTIGALL) 300 MG capsule Take by mouth 4 (four) times daily.    Yes Historical Provider, MD  VENTOLIN HFA 108 (90 BASE) MCG/ACT inhaler Inhale 2 puffs into the lungs every 6 (six) hours as needed.  07/01/14  Yes Historical Provider, MD  fluticasone (FLONASE) 50 MCG/ACT nasal spray Place 2 sprays into both nostrils daily.  07/05/14   Historical Provider, MD    Family History  Problem Relation Age of Onset  . Cancer Mother     lymphoma  . Heart attack Father      History  Substance Use Topics  . Smoking status: Current Every Day Smoker -- 1.00 packs/day for 50 years    Types: Cigarettes  . Smokeless tobacco: Never Used  . Alcohol Use: No    Allergies as of 09/28/2014 - Review  Complete 09/28/2014  Allergen Reaction Noted  . Acetaminophen Other (See Comments) 07/17/2014  . Clotrimazole  08/11/2014  . Codeine Hives 07/17/2014  . Fluticasone furoate-vilanterol Other (See Comments) 07/17/2014  . Hydrocodone-acetaminophen Itching 07/17/2014  . Ibuprofen Other (See Comments) 07/17/2014  . Prednisone Other (See Comments) and Swelling 07/17/2014  . Penicillin g Rash 08/11/2014  . Penicillins Rash 07/17/2014    Review of Systems:    All systems reviewed and negative except where noted in HPI.   Physical Exam:  Vital signs in last 24 hours: Temp:  [97.3 F (36.3 C)-98.1 F (36.7 C)] 98.1 F (36.7 C) (05/23 0550) Pulse Rate:  [92-98] 92 (05/23 0550) Resp:  [13-23] 23 (05/22 2328) BP: (71-131)/(54-84) 102/54 mmHg (05/23 0550) SpO2:  [91 %-100 %] 99 % (05/23 0550) Weight:  [184 lb (83.462 kg)] 184 lb (83.462 kg) (05/22 1640) Last BM Date: 09/29/14 General:   Pleasant, cooperative in NAD Head:  Normocephalic and atraumatic. Eyes:   No icterus.   Conjunctiva pink. PERRLA. Ears:  Normal auditory acuity. Neck:  Supple; no masses or thyroidomegaly Lungs: Respirations even and unlabored. Lungs clear to auscultation bilaterally.   No wheezes, crackles, or rhonchi.  Heart:  Regular rate and rhythm;  Without murmur, clicks, rubs or gallops Abdomen:  Soft, nondistended, nontender. Normal bowel sounds. No appreciable masses or hepatomegaly.  No rebound or guarding.  Rectal:  Not performed. Msk:  Symmetrical without gross deformities.  Strength equal and symmetrical  Extremities:  Without edema, cyanosis or clubbing. Neurologic:  Alert and oriented x3;  grossly normal neurologically. Skin:  Intact without significant lesions or rashes. Cervical Nodes:  No significant cervical adenopathy. Psych:  Alert and cooperative. Normal affect.  LAB RESULTS:  Recent Labs  09/28/14 1803 09/29/14 0431  WBC 5.7 5.2  HGB 11.5* 10.4*  HCT 34.4* 31.2*  PLT 116* 104*    BMET  Recent Labs  09/28/14 1803 09/29/14 0431  NA 132* 133*  K 2.9* 3.3*  CL 100* 108  CO2 23 20*  GLUCOSE 100* 88  BUN 7 8  CREATININE 0.46 0.33*  CALCIUM 8.1* 7.7*   LFT  Recent Labs  09/29/14 0431  PROT 5.1*  ALBUMIN 2.0*  AST 56*  ALT 40  ALKPHOS 403*  BILITOT 9.2*   PT/INR No results for input(s): LABPROT, INR in the last 72 hours.  STUDIES: Ct Head Wo Contrast  09/28/2014   CLINICAL DATA:  LEFT facial droop for 1 day, syncope. History of pancreatic cancer, on chemotherapy. History of lupus and ovarian cancer.  EXAM: CT HEAD  WITHOUT CONTRAST  TECHNIQUE: Contiguous axial images were obtained from the base of the skull through the vertex without intravenous contrast.  COMPARISON:  None.  FINDINGS: The ventricles and sulci are normal for age. No intraparenchymal hemorrhage, mass effect nor midline shift. Patchy supratentorial white matter hypodensities are less than expected patient's age and though non-specific suggest sequelae of chronic small vessel ischemic disease. No acute large vascular territory infarcts.  No abnormal extra-axial fluid collections. Basal cisterns are patent. Mild calcific atherosclerosis of the included vertebral arteries.  No skull fracture. The included ocular globes and orbital contents are non-suspicious. Bilateral ocular lens implants. The mastoid aircells and included paranasal sinuses are well-aerated. Patient is edentulous.  IMPRESSION: No acute intracranial process ; normal noncontrast CT of the head for age.   Electronically Signed   By: Elon Alas   On: 09/28/2014 18:25      Impression / Plan:   Tiffany Gamble is a 69 y.o. y/o female with a history of pancreatic cancer in the head and uncinate process with presentation of obstructive jaundice. The patient will be set up for an ERCP for today. The patient has been told the risks and benefits including pancreatitis bleeding and perforation.   Thank you for involving me in the  care of this patient.      LOS: 1 day   [facility], MD  09/29/2014, 1:05 PM

## 2014-09-30 ENCOUNTER — Other Ambulatory Visit: Payer: Self-pay | Admitting: Radiology

## 2014-09-30 ENCOUNTER — Inpatient Hospital Stay: Payer: Medicare Other

## 2014-09-30 ENCOUNTER — Encounter: Payer: Self-pay | Admitting: Gastroenterology

## 2014-09-30 DIAGNOSIS — R188 Other ascites: Secondary | ICD-10-CM | POA: Insufficient documentation

## 2014-09-30 DIAGNOSIS — R17 Unspecified jaundice: Secondary | ICD-10-CM | POA: Insufficient documentation

## 2014-09-30 LAB — COMPREHENSIVE METABOLIC PANEL
ALT: 38 U/L (ref 14–54)
AST: 52 U/L — ABNORMAL HIGH (ref 15–41)
Albumin: 2 g/dL — ABNORMAL LOW (ref 3.5–5.0)
Alkaline Phosphatase: 374 U/L — ABNORMAL HIGH (ref 38–126)
Anion gap: 6 (ref 5–15)
BILIRUBIN TOTAL: 10.6 mg/dL — AB (ref 0.3–1.2)
BUN: 8 mg/dL (ref 6–20)
CO2: 22 mmol/L (ref 22–32)
CREATININE: 0.37 mg/dL — AB (ref 0.44–1.00)
Calcium: 8.3 mg/dL — ABNORMAL LOW (ref 8.9–10.3)
Chloride: 107 mmol/L (ref 101–111)
GFR calc Af Amer: >60 mL/min (ref >60)
GFR calc non Af Amer: >60 mL/min (ref >60)
Glucose, Bld: 98 mg/dL (ref 65–99)
POTASSIUM: 3.7 mmol/L (ref 3.5–5.1)
Sodium: 135 mmol/L (ref 135–145)
Total Protein: 5.3 g/dL — ABNORMAL LOW (ref 6.5–8.1)

## 2014-09-30 LAB — PROTEIN, BODY FLUID: Total protein, fluid: <3 g/dL

## 2014-09-30 LAB — LACTATE DEHYDROGENASE: LDH: 113 U/L (ref 98–192)

## 2014-09-30 LAB — PROTIME-INR
INR: 1.26
PROTHROMBIN TIME: 16 s — AB (ref 11.4–15.0)

## 2014-09-30 LAB — GLUCOSE, SEROUS FLUID: Glucose, Fluid: 116 mg/dL

## 2014-09-30 LAB — BODY FLUID CELL COUNT WITH DIFFERENTIAL
EOS FL: 0 %
LYMPHS FL: 45 %
MONOCYTE-MACROPHAGE-SEROUS FLUID: 40 %
Neutrophil Count, Fluid: 15 %
Other Cells, Fluid: 0 %
WBC FLUID: 94 uL

## 2014-09-30 LAB — LIPASE, BLOOD: Lipase: 37 U/L (ref 22–51)

## 2014-09-30 LAB — AMYLASE: AMYLASE: 11 U/L — AB (ref 28–100)

## 2014-09-30 MED ORDER — VANCOMYCIN HCL IN DEXTROSE 1-5 GM/200ML-% IV SOLN
1000.0000 mg | Freq: Once | INTRAVENOUS | Status: AC
  Administered 2014-09-30: 23:00:00 1000 mg via INTRAVENOUS
  Filled 2014-09-30: qty 200

## 2014-09-30 MED ORDER — GENTAMICIN IN SALINE 1.6-0.9 MG/ML-% IV SOLN
80.0000 mg | Freq: Once | INTRAVENOUS | Status: AC
Start: 2014-09-30 — End: 2014-09-30
  Administered 2014-09-30: 21:00:00 80 mg via INTRAVENOUS
  Filled 2014-09-30: qty 50

## 2014-09-30 NOTE — Consult Note (Signed)
Patient not in her room for evaluation again today. Results from ERCP and paracentesis noted. Given her history of pancreatic cancer, this is likely progression of disease. Patient declined any further treatment several weeks ago but did not want to enroll in hospice at that time. With her probable progression of disease, hospice maybe more appropriate currently.  Appreciate consult, will follow.

## 2014-09-30 NOTE — Care Management (Signed)
Patient with pancreatic cancer.  She has had problems tolerating chemo and is not sure if will continued. Admitted to 2A with syncope.  Telemetry is stable.  GI has consulted and is going to attempt percutaneous billiary drain. It is documented that patient's disease is progressive.  Patient may benefit from palliative care consult for goals of treatment and address code status

## 2014-09-30 NOTE — Procedures (Signed)
Interventional Radiology Procedure Note  Procedure: US guided paracentesis & drainage of ascites, for therapeutic, diagnostic, and pre-PTC purposes.  Findings:  2.4L amber ascites drained.  Complications: None Recommendations:  - Drain will remain overnight - Intermittent cathter drainage over night - Labs sent for cyto and other labs.  - Routine care.  No need to flush. - NPO past midnight for [facility] tomorrow with Vascular and Interventional Radiology.   Signed,  Dulcy Fanny. Earleen Newport, DO

## 2014-09-30 NOTE — Progress Notes (Signed)
[facility] [place] NAME: Tiffany Gamble    MR#:  [id number]  DATE OF BIRTH:  02-28-1946  SUBJECTIVE:  CHIEF COMPLAINT:   Chief Complaint  Patient presents with  . Stroke Symptoms   EGD and ERCP attempted yesterday by GI, however not successful due to her history of bypass surgery prior. Interventional radiology guided transhepatic cholangiogram suggested which will be done tomorrow. For paracentesis today. Patient wants to eat a regular diet, family and patient upset about being on a liquid diet. Bilirubin remains elevated at 10.  REVIEW OF SYSTEMS:  ROS   Constitutional: Positive for malaise/fatigue. Negative for fever and chills.  Eyes:   Icteric sclerae  Respiratory: Negative for cough, shortness of breath and wheezing.  Cardiovascular: Negative for chest pain and palpitations.  Gastrointestinal: Positive for nausea and abdominal pain. Negative for vomiting, diarrhea and constipation.   Abdominal distention, ascites.  Genitourinary: Negative for dysuria.  Musculoskeletal: Positive for back pain.   Chronic pain  Skin:   Jaundiced  Neurological: Positive for weakness. Negative for dizziness, seizures and headaches.   DRUG ALLERGIES:   Allergies  Allergen Reactions  . Acetaminophen Other (See Comments)    Other reaction(s): Other (qualifier value) Liver problems Affects liver.  . Clotrimazole     Other reaction(s): Unknown Pt. does not know  . Codeine Hives    Other reaction(s): Weal  . Fluticasone Furoate-Vilanterol Other (See Comments)    thrush  . Hydrocodone-Acetaminophen Itching    Other reaction(s): Itching of Skin, Weal itching  . Ibuprofen Other (See Comments)    alters liver function Other reaction(s): Other (qualifier value) liver problems Affects liver.  . Prednisone Other (See Comments) and Swelling    Throat swelling  . Penicillin G Rash  . Penicillins Rash    VITALS:   Blood pressure 112/67, pulse 96, temperature 98.1 F (36.7 C), temperature source Oral, resp. rate 20, height 5\' 1"  (1.549 m), weight 91.4 kg (201 lb 8 oz), SpO2 93 %.  PHYSICAL EXAMINATION:  Physical Exam  Constitutional: She is oriented to person, place, and time. No distress.  HENT:  Head: Normocephalic and atraumatic.  Eyes: Conjunctivae are normal. Pupils are equal, round, and reactive to light.  Neck: Normal range of motion. Neck supple. No JVD present. No thyromegaly present.  Cardiovascular: Normal rate and regular rhythm.   Murmur heard. Pulmonary/Chest: No stridor. She is in respiratory distress. She has no wheezes. She has no rales. She exhibits no tenderness.  Abdominal: She exhibits distension. There is tenderness.  Ascites present. Hypoactive bowel sounds. Worsened abdominal distention.  Musculoskeletal: She exhibits edema.  Neurological: She is alert and oriented to person, place, and time. No cranial nerve deficit. She exhibits normal muscle tone. GCS score is 15.  Skin: No rash noted. She is not diaphoretic. No erythema.  Psychiatric:  Irritable mood. Normal memory.     LABORATORY PANEL:   CBC  Recent Labs Lab 09/29/14 0431  WBC 5.2  HGB 10.4*  HCT 31.2*  PLT 104*   ------------------------------------------------------------------------------------------------------------------  Chemistries   Recent Labs Lab 09/30/14 0449  NA 135  K 3.7  CL 107  CO2 22  GLUCOSE 98  BUN 8  CREATININE 0.37*  CALCIUM 8.3*  AST 52*  ALT 38  ALKPHOS 374*  BILITOT 10.6*   ------------------------------------------------------------------------------------------------------------------  Cardiac Enzymes  Recent Labs Lab 09/28/14 1803  TROPONINI <0.03   ------------------------------------------------------------------------------------------------------------------  RADIOLOGY:  Ct Head Wo Contrast  09/28/2014  CLINICAL DATA:  LEFT facial droop for 1  day, syncope. History of pancreatic cancer, on chemotherapy. History of lupus and ovarian cancer.  EXAM: CT HEAD WITHOUT CONTRAST  TECHNIQUE: Contiguous axial images were obtained from the base of the skull through the vertex without intravenous contrast.  COMPARISON:  None.  FINDINGS: The ventricles and sulci are normal for age. No intraparenchymal hemorrhage, mass effect nor midline shift. Patchy supratentorial white matter hypodensities are less than expected patient's age and though non-specific suggest sequelae of chronic small vessel ischemic disease. No acute large vascular territory infarcts.  No abnormal extra-axial fluid collections. Basal cisterns are patent. Mild calcific atherosclerosis of the included vertebral arteries.  No skull fracture. The included ocular globes and orbital contents are non-suspicious. Bilateral ocular lens implants. The mastoid aircells and included paranasal sinuses are well-aerated. Patient is edentulous.  IMPRESSION: No acute intracranial process ; normal noncontrast CT of the head for age.   Electronically Signed   By: Elon Alas   On: 09/28/2014 18:25   US Paracentesis  09/30/2014   CLINICAL DATA:  69 year old female with a history of pancreatic carcinoma, now with biliary obstruction secondary to pancreatic head mass.  A percutaneous transhepatic cholangiogram with drainage is indicated given her hyperbilirubinemia, and the ascites increases risk factors for percutaneous access.  She has been referred for image guided therapeutic, diagnostic, and preprocedure drainage.  EXAM: ULTRASOUND GUIDED  PARACENTESIS  COMPARISON:  None.  PROCEDURE: An ultrasound guided paracentesis was thoroughly discussed with the patient and questions answered. The benefits, risks, alternatives and complications were also discussed. The patient understands and wishes to proceed with the procedure. Written consent was obtained.  Ultrasound was performed to localize and mark an adequate  pocket of fluid in the left lower quadrant of the abdomen. The area was then prepped and draped in the normal sterile fashion. 1% Lidocaine was used for local anesthesia. Under ultrasound guidance a Safe-T-Centesis catheter was introduced. Paracentesis was performed.  After removal of fluid, the Safe-T-Centesis catheter was maintained, for intermittent drainage over night. This will be to evacuate as much fluid as possible from the abdomen given her pending percutaneous transhepatic cholangiogram and drainage.  The patient tolerated the procedure well and remained hemodynamically stable throughout.  No complications were encountered and no significant blood loss was encountered.  COMPLICATIONS: None.  FINDINGS: A total of approximately 2.4 L of amber ascitic fluid was removed. A fluid sample was sent for laboratory analysis.  IMPRESSION: Status post placement of Safe-T-Centesis catheter into the left lower quadrant for drainage of the abdomen. Fluid was sent to the lab for complete analysis.  The catheter will remain overnight pending the patient's upcoming percutaneous transhepatic cholangiogram.  Signed,  Dulcy Fanny. Earleen Newport DO  [facility]  [facility]  PLAN: The drainage catheter will remain overnight.  Intermittent drainage to evacuate as much fluid as possible from the peritoneal space.  The catheter may be removed after the percutaneous transhepatic cholangiogram on the following day.   Electronically Signed   By: Corrie Mckusick D.O.   On: 09/30/2014 13:47    EKG:   Orders placed or performed during the hospital encounter of 09/28/14  . EKG  . EKG    ASSESSMENT AND PLAN:   69 year old female with a past medical history significant for recent diagnosis of pancreatic cancer currently on chemotherapy, history of gastric bypass surgery, colonic polyps in the past, ovarian cancer status post surgical treatment, COPD and gastroesophageal reflux disease and  ongoing ascites presents to the hospital secondary to weakness and a syncopal episode. She was noted to have worsening of her obstructive jaundice.  #1 obstructive jaundice-rapid worsening within 2 weeks. In light of her pancreatic cancer. GI has been consulted. ERCP attempted, however was unsuccessful due to her bypass surgery history. Interventional radiology consulted for transhepatic cholangiogram. Will be done tomorrow morning. Nothing by mouth after midnight. For paracentesis today to prevent complications during the cholangiogram tomorrow. We'll give empiric Rocephin prior to procedure tomorrow. Peritoneal fluid sent for labs and cytology.  #2 pancreatic cancer-follows with Dr. Grayland Ormond.  Likely with worsening jaundice it could be worsening of her tumor. She did finish 3-4 cycles of chemotherapy and wanted to stop because of her side effects. They were planning to rediscuss about reinitiation of treatment versus hospice services next month. Dr. Grayland Ormond has been consulted  #3 syncope-likely secondary to dehydration. Improved.  #4 COPD-not in any acute exacerbation. Does have scattered wheeze. Continue Spiriva and added Symbicort. No indication for systemic steroids.  #5 chronic pain-continue her pain medications home dose. She follows up with the pain management physician and is on a pain contract. On fentanyl patch and oxycodone  #6 DVT prophylaxis-Ted's and SCDs.  We'll discontinue telemetry. Will transfer to oncology floor today.  All the records are reviewed and case discussed with Care Management/Social Workerr. Management plans discussed with the patient, family and they are in agreement.  CODE STATUS: Full code  TOTAL TIME TAKING CARE OF THIS PATIENT: 40 minutes.   POSSIBLE D/C IN 2-3 DAYS, DEPENDING ON CLINICAL CONDITION.   Gladstone Lighter M.D on 09/30/2014 at 2:29 PM  Between 7am to 6pm - Pager - [phone]  After 6pm go to [email or web address] - password [email or web address]  [facility]  Office  [phone]  CC: Primary care physician; LADA, Satira Anis, MD

## 2014-09-30 NOTE — Progress Notes (Signed)
  Attempted to see patient, but patient not in room, down for procedure.   LOS: 2 days  Tiffany Gamble  09/30/2014, 12:29 PM [facility]  [place], [place] [address] Phone: [phone] Fax : [phone]

## 2014-09-30 NOTE — Progress Notes (Signed)
Transfer  Pt arrived to unit via bed with transporter. Pt is alert and oriented and able follow commands. Pt is having moderate pain at this time and states she will let me know when she's ready for medication.

## 2014-09-30 NOTE — Consult Note (Signed)
Chief Complaint: Chief Complaint  Patient presents with  . Stroke Symptoms    Referring Physician(s): Dr. Lucilla Lame  History of Present Illness: Tiffany Gamble is a 69 y.o. female admitted to [facility] 09/28/2014 via the ED with symptoms of syncope.    She has a past history of pancreatic adenocarcinoma, for which she has been receiving chemotherapy.  She has no surgical history of record regarding a resection, and her most recent imaging 09/16/2014 shows a mass at the head of the pancreas, with the extra-hepatic biliary system apparently intact.   She has been found to have increased bilirubin on this admission, with jaundice, icterus, and pruritis.    A recent upper endoscopy 09/29/2014 confirms that her anatomy is not conducive to ampullary access, and the CT from 09/16/2014 shows intra-hepatic biliary ductal dilation.    She has been referred for evaluation of a possible percutaneous transhepatic cholangiogram, with subsequent drainage.    I spoke at length with the patient about the pathology, pathophysiology, and natural history of biliary obstruction, including her non-surgical candidacy.  She confirms that she did not do well with her recent chemotherapy, and she is uncertain if she will continue any chemotherapy for treatment.    I discussed the procedure of a PTC with drainage in detail, including the goal of crossing an obstructive lesion at the pancreatic head, with intention to stent the stricture/obstruction.  I did describe the occasional need to stage this procedure, and also the possibility that if there is inability to cross the stricture, the drain may be externalized permanently.  Specific risks that I discussed with her included:  Bleeding, infection, sepsis, anatomic injury, need for further procedure/surgery, contrast reaction, cardiopulmonary collapse, death.    I confirmed with the patient that she has established a living will, and she does not want heroic measures  to be taken in life-saving attempts/resuscitation. Of note, this is not currently her status, with a FULL CODE status recorded on the chart.    Past Medical History  Diagnosis Date  . Lupus   . Staph infection     MRSA  . Arthritis   . Cancer 2016    pancreas  . Ovarian cancer 2011    left    Past Surgical History  Procedure Laterality Date  . Tonsillectomy    . Appendectomy    . Hand surgery Bilateral   . Knee surgery Right   . Partial hysterectomy    . Tubal ligation    . Ventral hernia repair  03-17-10, 11-24-10    Dr Bary Castilla  . Umbilical hernia repair  02-09-11    and ventral by Dr Bary Castilla  . Laparoscopic lysis intestinal adhesions  03-09-10    Dr Bary Castilla  . Incision and drainage  09-17-10    with wound vac Dr Bary Castilla  . Abd wall hernia  09-07-10     Dr. Bary Castilla  . Hernia repair  2014    last surgery was Dr. Duke Salvia  . Gastric bypass    . Ercp N/A 09/29/2014    Procedure: ENDOSCOPIC RETROGRADE CHOLANGIOPANCREATOGRAPHY (ERCP);  Surgeon: Lucilla Lame, MD;  Location: [facility] ENDOSCOPY;  Service: Endoscopy;  Laterality: N/A;    Allergies: Acetaminophen; Clotrimazole; Codeine; Fluticasone furoate-vilanterol; Hydrocodone-acetaminophen; Ibuprofen; Prednisone; Penicillin g; and Penicillins  Medications: Prior to Admission medications   Medication Sig Start Date End Date Taking? Authorizing Provider  cyclobenzaprine (FLEXERIL) 10 MG tablet Take 1 tablet (10 mg total) by mouth 2 (two) times daily. 09/23/14  Yes  Lloyd Huger, MD  donepezil (ARICEPT) 5 MG tablet Take 5 mg by mouth at bedtime.  06/19/14  Yes Historical Provider, MD  fentaNYL (DURAGESIC - DOSED MCG/HR) 50 MCG/HR Place 1 patch (50 mcg total) onto the skin every 3 (three) days. 09/17/14  Yes Lloyd Huger, MD  furosemide (LASIX) 40 MG tablet Take 1 tablet (40 mg total) by mouth daily. 09/08/14  Yes Lloyd Huger, MD  lidocaine-prilocaine (EMLA) cream Apply 1 application topically as needed. Apply to port then cover  with saran wrap 1-2 hours prior to chemotherapy 09/10/14  Yes Lloyd Huger, MD  Multiple Vitamin (MULTI-VITAMINS) TABS Take by mouth daily.    Yes Historical Provider, MD  Oxycodone HCl 20 MG TABS Take 1 tablet (20 mg total) by mouth 4 (four) times daily as needed. 09/17/14  Yes Lloyd Huger, MD  pantoprazole (PROTONIX) 40 MG tablet Take 40 mg by mouth 2 (two) times daily.  07/05/14  Yes Historical Provider, MD  polyethylene glycol powder (GLYCOLAX/MIRALAX) powder Take by mouth 2 (two) times daily.  07/05/14  Yes Historical Provider, MD  potassium chloride SA (K-DUR,KLOR-CON) 20 MEQ tablet Take 2 tablets (40 mEq total) by mouth daily. 09/17/14  Yes Lloyd Huger, MD  prochlorperazine (COMPAZINE) 10 MG tablet Take 10 mg by mouth every 8 (eight) hours as needed.  07/16/14  Yes Historical Provider, MD  SPIRIVA HANDIHALER 18 MCG inhalation capsule Place 18 mcg into inhaler and inhale daily.  07/07/14  Yes Historical Provider, MD  ursodiol (ACTIGALL) 300 MG capsule Take by mouth 4 (four) times daily.    Yes Historical Provider, MD  VENTOLIN HFA 108 (90 BASE) MCG/ACT inhaler Inhale 2 puffs into the lungs every 6 (six) hours as needed.  07/01/14  Yes Historical Provider, MD  fluticasone (FLONASE) 50 MCG/ACT nasal spray Place 2 sprays into both nostrils daily.  07/05/14   Historical Provider, MD     Family History  Problem Relation Age of Onset  . Cancer Mother     lymphoma  . Heart attack Father     History   Social History  . Marital Status: Single    Spouse Name: N/A  . Number of Children: N/A  . Years of Education: N/A   Social History Main Topics  . Smoking status: Current Every Day Smoker -- 1.00 packs/day for 50 years    Types: Cigarettes  . Smokeless tobacco: Never Used  . Alcohol Use: No  . Drug Use: No  . Sexual Activity: Not on file   Other Topics Concern  . None   Social History Narrative    ECOG Status: 2 - Symptomatic, <50% confined to bed  Review of Systems:  A 12 point ROS discussed and pertinent positives are indicated in the HPI above.  All other systems are negative.  Review of Systems  Vital Signs: BP 112/67 mmHg  Pulse 96  Temp(Src) 98.1 F (36.7 C) (Oral)  Resp 20  Ht 5' 1"  (1.549 m)  Wt 201 lb 8 oz (91.4 kg)  BMI 38.09 kg/m2  SpO2 93%  Physical Exam   Atraumatic, normocephalic.  Alopecia.  No glasses.  Conjugate gaze.  Icterus present. No scleral injection.  A&O x 3.  Answers questions appropriately.  Normal affect. No labored breathing.  Wheezes present bilateral.  RRR.  No third heart sounds.  Abdomen is distended with mild fluid wave.   GU deferred. Mild swelling of the lower extremity. Palpable distal pulses both feet.  Mallampati Score:  2  Imaging: Ct Head Wo Contrast  09/28/2014   CLINICAL DATA:  LEFT facial droop for 1 day, syncope. History of pancreatic cancer, on chemotherapy. History of lupus and ovarian cancer.  EXAM: CT HEAD WITHOUT CONTRAST  TECHNIQUE: Contiguous axial images were obtained from the base of the skull through the vertex without intravenous contrast.  COMPARISON:  None.  FINDINGS: The ventricles and sulci are normal for age. No intraparenchymal hemorrhage, mass effect nor midline shift. Patchy supratentorial white matter hypodensities are less than expected patient's age and though non-specific suggest sequelae of chronic small vessel ischemic disease. No acute large vascular territory infarcts.  No abnormal extra-axial fluid collections. Basal cisterns are patent. Mild calcific atherosclerosis of the included vertebral arteries.  No skull fracture. The included ocular globes and orbital contents are non-suspicious. Bilateral ocular lens implants. The mastoid aircells and included paranasal sinuses are well-aerated. Patient is edentulous.  IMPRESSION: No acute intracranial process ; normal noncontrast CT of the head for age.   Electronically Signed   By: Elon Alas   On: 09/28/2014 18:25   Ct  Abdomen Pelvis W Contrast  09/16/2014   CLINICAL DATA:  Restaging pancreatic cancer  EXAM: CT ABDOMEN AND PELVIS WITH CONTRAST  TECHNIQUE: Multidetector CT imaging of the abdomen and pelvis was performed using the standard protocol following bolus administration of intravenous contrast.  CONTRAST:  100 mL Omnipaque 300 IV  COMPARISON:  06/10/2014  FINDINGS: Lower chest:  Mild scarring in the lingula/left lower lobe.  Hepatobiliary: Cirrhotic configuration of the liver. No suspicious/enhancing hepatic lesions.  Status post cholecystectomy. Moderate intrahepatic and extrahepatic ductal dilatation, increased.  Pancreas: 3.8 x 2.8 cm hypoenhancing mass along the medial aspect of the pancreatic head/uncinate process (series 2/ image 35), involving/encasing the SMA and SMV, previously 3.7 x 3.1 cm. Associated dilatation of the main pancreatic duct.  Spleen: Splenomegaly, measuring 19.1 cm.  Adrenals/Urinary Tract: Adrenal glands are within normal limits.  Kidneys within normal limits.  No hydronephrosis.  Bladder is within normal limits.  Stomach/Bowel: Prior gastric surgery with gastrojejunostomy.  No evidence of bowel obstruction.  Vascular/Lymphatic: Atherosclerotic calcifications of the abdominal aorta and branch vessels.  Portal vein is patent. Tumor involves the undersurface of the portal vein/portosplenic confluence (coronal image 73). Tumor encases the proximal aspect of the SMA (series 2/image 33).  Upper abdominal/retroperitoneal lymphadenopathy, including:  --12 mm short axis right juxta diaphragmatic node (series 2/image 7), previously 13 mm  --15 mm short axis gastrohepatic node (series 2/ image 22), previously 16 mm  --12 mm short axis aortocaval node (series 2/ image 35), previously 17 mm  Reproductive: Status post hysterectomy.  No adnexal masses.  Other: Moderate abdominopelvic ascites.  Associated mild peritoneal thickening/nodularity in the pelvis (series 2/ image 72), worrisome for peritoneal disease.   Postsurgical changes related to prior ventral hernia mesh repair.  Musculoskeletal: Degenerative changes of the visualized thoracolumbar spine.  IMPRESSION: 3.8 cm hypoenhancing mass along the medial aspect of the pancreatic head/uncinate process, corresponding to known primary pancreatic neoplasm, grossly unchanged.  Associated vascular involvement, as described above.  Upper abdominal lymphadenopathy, mildly decreased.  Moderate abdominal ascites with associated peritoneal thickening/ nodularity, worrisome for peritoneal disease.  Additional ancillary findings as above.   Electronically Signed   By: Julian Hy M.D.   On: 09/16/2014 12:49   US Paracentesis  09/30/2014   CLINICAL DATA:  69 year old female with a history of pancreatic carcinoma, now with biliary obstruction secondary to pancreatic head mass.  A  percutaneous transhepatic cholangiogram with drainage is indicated given her hyperbilirubinemia, and the ascites increases risk factors for percutaneous access.  She has been referred for image guided therapeutic, diagnostic, and preprocedure drainage.  EXAM: ULTRASOUND GUIDED  PARACENTESIS  COMPARISON:  None.  PROCEDURE: An ultrasound guided paracentesis was thoroughly discussed with the patient and questions answered. The benefits, risks, alternatives and complications were also discussed. The patient understands and wishes to proceed with the procedure. Written consent was obtained.  Ultrasound was performed to localize and mark an adequate pocket of fluid in the left lower quadrant of the abdomen. The area was then prepped and draped in the normal sterile fashion. 1% Lidocaine was used for local anesthesia. Under ultrasound guidance a Safe-T-Centesis catheter was introduced. Paracentesis was performed.  After removal of fluid, the Safe-T-Centesis catheter was maintained, for intermittent drainage over night. This will be to evacuate as much fluid as possible from the abdomen given her pending  percutaneous transhepatic cholangiogram and drainage.  The patient tolerated the procedure well and remained hemodynamically stable throughout.  No complications were encountered and no significant blood loss was encountered.  COMPLICATIONS: None.  FINDINGS: A total of approximately 2.4 L of amber ascitic fluid was removed. A fluid sample was sent for laboratory analysis.  IMPRESSION: Status post placement of Safe-T-Centesis catheter into the left lower quadrant for drainage of the abdomen. Fluid was sent to the lab for complete analysis.  The catheter will remain overnight pending the patient's upcoming percutaneous transhepatic cholangiogram.  Signed,  Dulcy Fanny. Earleen Newport DO  [facility]  [facility]  PLAN: The drainage catheter will remain overnight.  Intermittent drainage to evacuate as much fluid as possible from the peritoneal space.  The catheter may be removed after the percutaneous transhepatic cholangiogram on the following day.   Electronically Signed   By: Corrie Mckusick D.O.   On: 09/30/2014 13:47   [place] Attempted Paracentesis ([facility] Hx)  09/02/2014   CLINICAL DATA:  Pancreatic carcinoma, ascites status post 2 L paracentesis on 08/28/2014. The patient presents for additional paracentesis, if needed.  EXAM: LIMITED ABDOMEN ULTRASOUND FOR ASCITES  TECHNIQUE: Limited ultrasound survey for ascites was performed in all four abdominal quadrants.  COMPARISON:  08/28/2014  FINDINGS: Survey of the peritoneal cavity shows a small amount of ascites around the liver and also a small amount in the left upper quadrant adjacent to the upper spleen. A trace amount of fluid is present in both lower quadrants. There was not enough remaining ascites to warrant paracentesis today.  IMPRESSION: Small amount of ascites in the peritoneal cavity. Paracentesis was not performed today.   Electronically Signed   By: Aletta Edouard M.D.   On: 09/02/2014 14:55     Labs:  CBC:  Recent Labs  09/10/14 1048 09/17/14 0904 09/28/14 1803 09/29/14 0431  WBC 4.2 4.1 5.7 5.2  HGB 10.9* 11.9* 11.5* 10.4*  HCT 32.6* 36.1 34.4* 31.2*  PLT 202 140* 116* 104*    COAGS:  Recent Labs  11/19/13 1545 02/25/14 1555 05/14/14 1030 09/30/14 1325  INR 1.0 1.1 1.1 1.26    BMP:  Recent Labs  09/17/14 0904 09/28/14 1803 09/29/14 0431 09/30/14 0449  NA 134* 132* 133* 135  K 2.9* 2.9* 3.3* 3.7  CL 100* 100* 108 107  CO2 27 23 20* 22  GLUCOSE 132* 100* 88 98  BUN 10 7 8 8   CALCIUM 8.2* 8.1* 7.7* 8.3*  CREATININE 0.66 0.46 0.33* 0.37*  GFRNONAA >60 >60 >60 >  60  GFRAA >60 >60 >60 >60    LIVER FUNCTION TESTS:  Recent Labs  09/17/14 0904 09/28/14 1803 09/29/14 0431 09/30/14 0449  BILITOT 2.7* 10.1* 9.2* 10.6*  AST 56* 66* 56* 52*  ALT 33 45 40 38  ALKPHOS 475* 438* 403* 374*  PROT 6.1* 6.1* 5.1* 5.3*  ALBUMIN 2.7* 2.4* 2.0* 2.0*    TUMOR MARKERS:  Recent Labs  11/19/13 1545 05/29/14 1037  AFPTM 1.1 1.5    Assessment and Plan:  69 year old female with non-operable pancreatic tumor, now on hiatus from chemotherapy secondary to intolerance presents with biliary obstruction, hyperbilirubinemia, and jaundice/icterus/pruritis.  She is a candidate for a PTC/drainage, hopefully with early conversion to a stent for relief of the obstruction.    She does not have anatomy conducive to ERCP, as she has had prior gastric bypass.   She does have an increased risk of bleeding because of ascites development (portal hypertension versus malignancy), so the patient will undergo ascites drainage overnight for decreased ascites.  She has also had a prior ventral hernia repair, with apparent mesh on the body wall in the midline.  This may pose a risk for infection if the drain/access needs to cross the material.    I discussed the PTC/drainage in full detail, including risks/benefits and goals, as above, with a full informed consent performed.   All of her questions answered to the best of my ability.    Thank you for this interesting consult.  I greatly enjoyed meeting NARYA BEAVIN and look forward to participating in her care.  SignedCorrie Mckusick 09/30/2014, 3:51 PM   I spent a total of 60 Miinutes    in face to face in clinical consultation, greater than 50% of which was counseling/coordinating care for hyperbilirubinemia, biliary obstruction, pancreatic tumor, and need for PTC and drainage.

## 2014-10-01 ENCOUNTER — Inpatient Hospital Stay: Payer: Medicare Other

## 2014-10-01 ENCOUNTER — Other Ambulatory Visit: Payer: Medicare Other

## 2014-10-01 DIAGNOSIS — D696 Thrombocytopenia, unspecified: Secondary | ICD-10-CM

## 2014-10-01 DIAGNOSIS — Z90710 Acquired absence of both cervix and uterus: Secondary | ICD-10-CM

## 2014-10-01 DIAGNOSIS — R188 Other ascites: Secondary | ICD-10-CM

## 2014-10-01 DIAGNOSIS — Z79899 Other long term (current) drug therapy: Secondary | ICD-10-CM

## 2014-10-01 DIAGNOSIS — Z809 Family history of malignant neoplasm, unspecified: Secondary | ICD-10-CM

## 2014-10-01 DIAGNOSIS — R0602 Shortness of breath: Secondary | ICD-10-CM

## 2014-10-01 DIAGNOSIS — R52 Pain, unspecified: Secondary | ICD-10-CM

## 2014-10-01 DIAGNOSIS — R55 Syncope and collapse: Secondary | ICD-10-CM

## 2014-10-01 DIAGNOSIS — R5383 Other fatigue: Secondary | ICD-10-CM

## 2014-10-01 DIAGNOSIS — R531 Weakness: Secondary | ICD-10-CM

## 2014-10-01 DIAGNOSIS — C799 Secondary malignant neoplasm of unspecified site: Secondary | ICD-10-CM

## 2014-10-01 DIAGNOSIS — F17210 Nicotine dependence, cigarettes, uncomplicated: Secondary | ICD-10-CM

## 2014-10-01 DIAGNOSIS — R630 Anorexia: Secondary | ICD-10-CM

## 2014-10-01 DIAGNOSIS — Z8543 Personal history of malignant neoplasm of ovary: Secondary | ICD-10-CM

## 2014-10-01 DIAGNOSIS — C259 Malignant neoplasm of pancreas, unspecified: Secondary | ICD-10-CM

## 2014-10-01 LAB — CBC
HEMATOCRIT: 33.4 % — AB (ref 35.0–47.0)
HEMOGLOBIN: 11 g/dL — AB (ref 12.0–16.0)
MCH: 31 pg (ref 26.0–34.0)
MCHC: 32.9 g/dL (ref 32.0–36.0)
MCV: 94.4 fL (ref 80.0–100.0)
Platelets: 127 10*3/uL — ABNORMAL LOW (ref 150–440)
RBC: 3.54 MIL/uL — ABNORMAL LOW (ref 3.80–5.20)
RDW: 15.5 % — AB (ref 11.5–14.5)
WBC: 4 10*3/uL (ref 3.6–11.0)

## 2014-10-01 LAB — COMPREHENSIVE METABOLIC PANEL
ALBUMIN: 2.1 g/dL — AB (ref 3.5–5.0)
ALK PHOS: 455 U/L — AB (ref 38–126)
ALT: 40 U/L (ref 14–54)
ANION GAP: 6 (ref 5–15)
AST: 62 U/L — AB (ref 15–41)
BILIRUBIN TOTAL: 11.6 mg/dL — AB (ref 0.3–1.2)
BUN: 6 mg/dL (ref 6–20)
CALCIUM: 8.3 mg/dL — AB (ref 8.9–10.3)
CO2: 21 mmol/L — ABNORMAL LOW (ref 22–32)
Chloride: 107 mmol/L (ref 101–111)
Creatinine, Ser: 0.31 mg/dL — ABNORMAL LOW (ref 0.44–1.00)
GFR calc non Af Amer: >60 mL/min (ref >60)
Glucose, Bld: 112 mg/dL — ABNORMAL HIGH (ref 65–99)
POTASSIUM: 3.3 mmol/L — AB (ref 3.5–5.1)
SODIUM: 134 mmol/L — AB (ref 135–145)
Total Protein: 5.3 g/dL — ABNORMAL LOW (ref 6.5–8.1)

## 2014-10-01 MED ORDER — VANCOMYCIN HCL IN DEXTROSE 1-5 GM/200ML-% IV SOLN
1000.0000 mg | Freq: Once | INTRAVENOUS | Status: AC
  Administered 2014-10-01: 1000 mg via INTRAVENOUS
  Filled 2014-10-01: qty 200

## 2014-10-01 MED ORDER — POTASSIUM CHLORIDE CRYS ER 20 MEQ PO TBCR
40.0000 meq | EXTENDED_RELEASE_TABLET | Freq: Once | ORAL | Status: DC
  Filled 2014-10-01: qty 2

## 2014-10-01 MED ORDER — FENTANYL CITRATE (PF) 100 MCG/2ML IJ SOLN
INTRAMUSCULAR | Status: AC | PRN
  Administered 2014-10-01 (×2): 25 ug via INTRAVENOUS
  Administered 2014-10-01: 50 ug via INTRAVENOUS

## 2014-10-01 MED ORDER — MIDAZOLAM HCL 5 MG/5ML IJ SOLN
INTRAMUSCULAR | Status: AC | PRN
  Administered 2014-10-01 (×2): 1 mg via INTRAVENOUS

## 2014-10-01 MED ORDER — FENTANYL CITRATE (PF) 100 MCG/2ML IJ SOLN
INTRAMUSCULAR | Status: AC
  Filled 2014-10-01: qty 4

## 2014-10-01 MED ORDER — POTASSIUM CHLORIDE 20 MEQ PO PACK
40.0000 meq | PACK | Freq: Once | ORAL | Status: AC
  Administered 2014-10-01: 40 meq via ORAL
  Filled 2014-10-01: qty 2

## 2014-10-01 MED ORDER — FENTANYL 75 MCG/HR TD PT72
75.0000 ug | MEDICATED_PATCH | TRANSDERMAL | Status: DC
  Administered 2014-10-01 – 2014-10-04 (×2): 75 ug via TRANSDERMAL
  Filled 2014-10-01 (×2): qty 1

## 2014-10-01 MED ORDER — SODIUM CHLORIDE 0.9 % IV SOLN
INTRAVENOUS | Status: DC
  Administered 2014-10-01: 09:00:00 via INTRAVENOUS

## 2014-10-01 MED ORDER — LIDOCAINE HCL (PF) 1 % IJ SOLN
INTRAMUSCULAR | Status: AC
  Filled 2014-10-01: qty 10

## 2014-10-01 MED ORDER — MIDAZOLAM HCL 5 MG/5ML IJ SOLN
INTRAMUSCULAR | Status: AC
  Filled 2014-10-01: qty 10

## 2014-10-01 MED ORDER — GENTAMICIN IN SALINE 1.6-0.9 MG/ML-% IV SOLN
80.0000 mg | Freq: Once | INTRAVENOUS | Status: AC
  Administered 2014-10-01: 09:00:00 80 mg via INTRAVENOUS
  Filled 2014-10-01: qty 50

## 2014-10-01 MED ORDER — LIDOCAINE HCL (PF) 1 % IJ SOLN
INTRAMUSCULAR | Status: AC | PRN
  Administered 2014-10-01: 11:00:00 10 mL

## 2014-10-01 MED ORDER — IOHEXOL 300 MG/ML  SOLN
40.0000 mL | Freq: Once | INTRAMUSCULAR | Status: AC | PRN
  Administered 2014-10-01: 11:00:00 40 mL via INTRAVENOUS

## 2014-10-01 NOTE — Progress Notes (Signed)
Notified Interventional Radiologist Pascal Lux)  in [place] due to drainage leakage around site of catheter. - attach catheter back to suction until  drainage complete per [place].

## 2014-10-01 NOTE — Consult Note (Signed)
[place]  [phone]) [phone] Fax:(336) [phone]  ID: Tiffany Gamble OB: 1945-11-06  MR#: [id number]  PFX#:[id number]  Patient Care Team: [name], MD as PCP - General (Family Medicine) Robert Bellow, MD (General Surgery) Lloyd Huger, MD as Consulting Physician (Oncology) Clent Jacks, RN as Registered Nurse  CHIEF COMPLAINT:  Chief Complaint  Patient presents with  . Stroke Symptoms    INTERVAL HISTORY: Patient is a 69 year old female with a history of stage IV pancreatic cancer who presented to the emergency room several days ago with a syncopal episode. Subsequent workup did not reveal any distinct neurologic problem, but she was noted to have a bilirubin of greater than 10 with progressive ascites. She also continues to have significant pain. Since admission she feels significantly better, but still has a decreased performance status. Her pain is better controlled. She continues to not have an appetite. Patient feels generally terrible, but offers no further complaints.  REVIEW OF SYSTEMS:   Review of Systems  Constitutional: Positive for malaise/fatigue. Negative for fever.  Respiratory: Positive for shortness of breath.   Neurological: Positive for weakness.    As per HPI. Otherwise, a complete review of systems is negatve.  PAST MEDICAL HISTORY: Past Medical History  Diagnosis Date  . Lupus   . Staph infection     MRSA  . Arthritis   . Cancer 2016    pancreas  . Ovarian cancer 2011    left    PAST SURGICAL HISTORY: Past Surgical History  Procedure Laterality Date  . Tonsillectomy    . Appendectomy    . Hand surgery Bilateral   . Knee surgery Right   . Partial hysterectomy    . Tubal ligation    . Ventral hernia repair  03-17-10, 11-24-10    Dr Bary Castilla  . Umbilical hernia repair  02-09-11    and ventral by Dr Bary Castilla  . Laparoscopic lysis intestinal adhesions  03-09-10    Dr Bary Castilla  . Incision and drainage   09-17-10    with wound vac Dr Bary Castilla  . Abd wall hernia  09-07-10     Dr. Bary Castilla  . Hernia repair  2014    last surgery was Dr. Duke Salvia  . Gastric bypass    . Ercp N/A 09/29/2014    Procedure: ENDOSCOPIC RETROGRADE CHOLANGIOPANCREATOGRAPHY (ERCP);  Surgeon: Lucilla Lame, MD;  Location[facility] ENDOSCOPY;  Service: Endoscopy;  Laterality: N/A;    FAMILY HISTORY Family History  Problem Relation Age of Onset  . Cancer Mother     lymphoma  . Heart attack Father        ADVANCED DIRECTIVES:    HEALTH MAINTENANCE: History  Substance Use Topics  . Smoking status: Current Every Day Smoker -- 1.00 packs/day for 50 years    Types: Cigarettes  . Smokeless tobacco: Never Used  . Alcohol Use: No     Colonoscopy:  PAP:  Bone density:  Lipid panel:  Allergies  Allergen Reactions  . Acetaminophen Other (See Comments)    Other reaction(s): Other (qualifier value) Liver problems Affects liver.  . Clotrimazole     Other reaction(s): Unknown Pt. does not know  . Codeine Hives    Other reaction(s): Weal  . Fluticasone Furoate-Vilanterol Other (See Comments)    thrush  . Hydrocodone-Acetaminophen Itching    Other reaction(s): Itching of Skin, Weal itching  . Ibuprofen Other (See Comments)    alters liver function Other reaction(s): Other (qualifier value) liver problems Affects  liver.  . Prednisone Other (See Comments) and Swelling    Throat swelling  . Penicillin G Rash  . Penicillins Rash    Current Facility-Administered Medications  Medication Dose Route Frequency Provider Last Rate Last Dose  . budesonide-formoterol (SYMBICORT) 160-4.5 MCG/ACT inhaler 2 puff  2 puff Inhalation BID Gladstone Lighter, MD   2 puff at 09/30/14 2103  . cyclobenzaprine (FLEXERIL) tablet 10 mg  10 mg Oral BID Lytle Butte, MD   10 mg at 10/01/14 1211  . donepezil (ARICEPT) tablet 5 mg  5 mg Oral QHS Lytle Butte, MD   5 mg at 09/30/14 2103  . fentaNYL (DURAGESIC - dosed mcg/hr) 75 mcg  75 mcg  Transdermal Q72H Judith Part Mantzouris, NP      . fentaNYL (SUBLIMAZE) 100 MCG/2ML injection           . fluticasone (FLONASE) 50 MCG/ACT nasal spray 2 spray  2 spray Each Nare Daily Lytle Butte, MD   2 spray at 10/01/14 1211  . midazolam (VERSED) 5 MG/5ML injection           . nicotine (NICOTROL) 10 MG inhaler 1 continuous puffing  1 continuous puffing Inhalation PRN Lytle Butte, MD   1 continuous puffing at 09/30/14 2311  . ondansetron (ZOFRAN) tablet 4 mg  4 mg Oral Q6H PRN Lytle Butte, MD       Or  . ondansetron [facility]) injection 4 mg  4 mg Intravenous Q6H PRN Lytle Butte, MD      . oxyCODONE (Oxy IR/ROXICODONE) immediate release tablet 20 mg  20 mg Oral QID PRN Lytle Butte, MD   20 mg at 10/01/14 1655  . pantoprazole (PROTONIX) EC tablet 40 mg  40 mg Oral BID Lytle Butte, MD   40 mg at 09/30/14 2103  . potassium chloride (KLOR-CON) packet 40 mEq  40 mEq Oral Once Gladstone Lighter, MD      . tiotropium [facility]) inhalation capsule 18 mcg  18 mcg Inhalation Daily Lytle Butte, MD   18 mcg at 10/01/14 1211  . ursodiol (ACTIGALL) capsule 300 mg  300 mg Oral Daily Lytle Butte, MD   300 mg at 09/30/14 1018   Facility-Administered Medications Ordered in Other Encounters  Medication Dose Route Frequency Provider Last Rate Last Dose  . sodium chloride 0.9 % injection 10 mL  10 mL Intravenous PRN Lloyd Huger, MD   10 mL at 09/10/14 1030    OBJECTIVE: Filed Vitals:   10/01/14 1314  BP: 106/75  Pulse: 97  Temp: 97.7 F (36.5 C)  Resp: 18     Body mass index is 38.09 kg/(m^2).    ECOG FS:3 - Symptomatic, >50% confined to bed  General: Ill-appearing, no acute distress. Eyes: icteric sclera. HEENT: Normocephalic, moist mucous membranes, clear oropharnyx. Lungs: Clear to auscultation bilaterally. Heart: Regular rate and rhythm. No rubs, murmurs, or gallops. Abdomen: Soft, nontender, distended. Drains noted in place. Musculoskeletal: No edema, cyanosis, or  clubbing. Neuro: Alert, answering all questions appropriately. Cranial nerves grossly intact. Skin: Jaundiced.  Psych: Normal affect.   LAB RESULTS:  Lab Results  Component Value Date   NA 134* 10/01/2014   K 3.3* 10/01/2014   CL 107 10/01/2014   CO2 21* 10/01/2014   GLUCOSE 112* 10/01/2014   BUN 6 10/01/2014   CREATININE 0.31* 10/01/2014   CALCIUM 8.3* 10/01/2014   PROT 5.3* 10/01/2014   ALBUMIN 2.1* 10/01/2014   AST 62* 10/01/2014  ALT 40 10/01/2014   ALKPHOS 455* 10/01/2014   BILITOT 11.6* 10/01/2014   GFRNONAA >60 10/01/2014   GFRAA >60 10/01/2014    Lab Results  Component Value Date   WBC 4.0 10/01/2014   NEUTROABS 3.1 09/17/2014   HGB 11.0* 10/01/2014   HCT 33.4* 10/01/2014   MCV 94.4 10/01/2014   PLT 127* 10/01/2014     STUDIES: Ct Head Wo Contrast  09/28/2014   CLINICAL DATA:  LEFT facial droop for 1 day, syncope. History of pancreatic cancer, on chemotherapy. History of lupus and ovarian cancer.  EXAM: CT HEAD WITHOUT CONTRAST  TECHNIQUE: Contiguous axial images were obtained from the base of the skull through the vertex without intravenous contrast.  COMPARISON:  None.  FINDINGS: The ventricles and sulci are normal for age. No intraparenchymal hemorrhage, mass effect nor midline shift. Patchy supratentorial white matter hypodensities are less than expected patient's age and though non-specific suggest sequelae of chronic small vessel ischemic disease. No acute large vascular territory infarcts.  No abnormal extra-axial fluid collections. Basal cisterns are patent. Mild calcific atherosclerosis of the included vertebral arteries.  No skull fracture. The included ocular globes and orbital contents are non-suspicious. Bilateral ocular lens implants. The mastoid aircells and included paranasal sinuses are well-aerated. Patient is edentulous.  IMPRESSION: No acute intracranial process ; normal noncontrast CT of the head for age.   Electronically Signed   By: Elon Alas   On: 09/28/2014 18:25   Ct Abdomen Pelvis W Contrast  09/16/2014   CLINICAL DATA:  Restaging pancreatic cancer  EXAM: CT ABDOMEN AND PELVIS WITH CONTRAST  TECHNIQUE: Multidetector CT imaging of the abdomen and pelvis was performed using the standard protocol following bolus administration of intravenous contrast.  CONTRAST:  100 mL Omnipaque 300 IV  COMPARISON:  06/10/2014  FINDINGS: Lower chest:  Mild scarring in the lingula/left lower lobe.  Hepatobiliary: Cirrhotic configuration of the liver. No suspicious/enhancing hepatic lesions.  Status post cholecystectomy. Moderate intrahepatic and extrahepatic ductal dilatation, increased.  Pancreas: 3.8 x 2.8 cm hypoenhancing mass along the medial aspect of the pancreatic head/uncinate process (series 2/ image 35), involving/encasing the SMA and SMV, previously 3.7 x 3.1 cm. Associated dilatation of the main pancreatic duct.  Spleen: Splenomegaly, measuring 19.1 cm.  Adrenals/Urinary Tract: Adrenal glands are within normal limits.  Kidneys within normal limits.  No hydronephrosis.  Bladder is within normal limits.  Stomach/Bowel: Prior gastric surgery with gastrojejunostomy.  No evidence of bowel obstruction.  Vascular/Lymphatic: Atherosclerotic calcifications of the abdominal aorta and branch vessels.  Portal vein is patent. Tumor involves the undersurface of the portal vein/portosplenic confluence (coronal image 73). Tumor encases the proximal aspect of the SMA (series 2/image 33).  Upper abdominal/retroperitoneal lymphadenopathy, including:  --12 mm short axis right juxta diaphragmatic node (series 2/image 7), previously 13 mm  --15 mm short axis gastrohepatic node (series 2/ image 22), previously 16 mm  --12 mm short axis aortocaval node (series 2/ image 35), previously 17 mm  Reproductive: Status post hysterectomy.  No adnexal masses.  Other: Moderate abdominopelvic ascites.  Associated mild peritoneal thickening/nodularity in the pelvis (series 2/ image  72), worrisome for peritoneal disease.  Postsurgical changes related to prior ventral hernia mesh repair.  Musculoskeletal: Degenerative changes of the visualized thoracolumbar spine.  IMPRESSION: 3.8 cm hypoenhancing mass along the medial aspect of the pancreatic head/uncinate process, corresponding to known primary pancreatic neoplasm, grossly unchanged.  Associated vascular involvement, as described above.  Upper abdominal lymphadenopathy, mildly decreased.  Moderate abdominal ascites  with associated peritoneal thickening/ nodularity, worrisome for peritoneal disease.  Additional ancillary findings as above.   Electronically Signed   By: Julian Hy M.D.   On: 09/16/2014 12:49   US Paracentesis  09/30/2014   CLINICAL DATA:  69 year old female with a history of pancreatic carcinoma, now with biliary obstruction secondary to pancreatic head mass.  A percutaneous transhepatic cholangiogram with drainage is indicated given her hyperbilirubinemia, and the ascites increases risk factors for percutaneous access.  She has been referred for image guided therapeutic, diagnostic, and preprocedure drainage.  EXAM: ULTRASOUND GUIDED  PARACENTESIS  COMPARISON:  None.  PROCEDURE: An ultrasound guided paracentesis was thoroughly discussed with the patient and questions answered. The benefits, risks, alternatives and complications were also discussed. The patient understands and wishes to proceed with the procedure. Written consent was obtained.  Ultrasound was performed to localize and mark an adequate pocket of fluid in the left lower quadrant of the abdomen. The area was then prepped and draped in the normal sterile fashion. 1% Lidocaine was used for local anesthesia. Under ultrasound guidance a Safe-T-Centesis catheter was introduced. Paracentesis was performed.  After removal of fluid, the Safe-T-Centesis catheter was maintained, for intermittent drainage over night. This will be to evacuate as much fluid as  possible from the abdomen given her pending percutaneous transhepatic cholangiogram and drainage.  The patient tolerated the procedure well and remained hemodynamically stable throughout.  No complications were encountered and no significant blood loss was encountered.  COMPLICATIONS: None.  FINDINGS: A total of approximately 2.4 L of amber ascitic fluid was removed. A fluid sample was sent for laboratory analysis.  IMPRESSION: Status post placement of Safe-T-Centesis catheter into the left lower quadrant for drainage of the abdomen. Fluid was sent to the lab for complete analysis.  The catheter will remain overnight pending the patient's upcoming percutaneous transhepatic cholangiogram.  Signed,  Dulcy Fanny. Earleen Newport DO  [facility]  [facility]  PLAN: The drainage catheter will remain overnight.  Intermittent drainage to evacuate as much fluid as possible from the peritoneal space.  The catheter may be removed after the percutaneous transhepatic cholangiogram on the following day.   Electronically Signed   By: Corrie Mckusick D.O.   On: 09/30/2014 13:47   Ir Percutaneous Transhepatic Cholangiogram  10/01/2014   Inez Catalina, MD     10/01/2014 11:13 AM Cholangiogram with 55F drain   Complications:  None  Blood Loss: none  See dictation in canopy pacs   Ir Biliary Drain Placement With Cholangiogram  10/01/2014   CLINICAL DATA:  Known pancreatic carcinoma with common bile duct obstruction  EXAM: IR BILIARY DRAIN PLACEMENT W/ CHOLANGIOGRAM  MEDICATIONS: 2 mg IV Versed , 100 mcg IV Fentanyl, 20 mL 1% lidocaine. The sedation time was 1 hour  CONTRAST:  13mL OMNIPAQUE IOHEXOL 300 MG/ML  SOLN  FLUOROSCOPY TIME:  Radiation Exposure Index (as provided by the fluoroscopic device): Not available  If the device does not provide the exposure index:  Fluoroscopy Time (in minutes and seconds):  14 minutes 30 seconds  Number of Acquired Images:  5  PROCEDURE: The patient was advised of  the possible risks and complications and agreed to undergo the procedure. Following an appropriate time-out the patient was prepped and draped in the standard sterile manner along the right mid axillary line. 1% lidocaine was utilized for local anesthesia.  Utilizing fluoroscopic guidance, a 20 gauge diamond tip needle was placed into the right lobe of the liver. The approximately  6 passes were made prior to gaining access to a right hepatic biliary duct. Contrast was injected and within a wire was placed and coursed ports the central common hepatic duct. The tract was then dilated with a 6 [place] Accustick dilator however upon removing the central portion of the dilator it was found that the wire had passed through the biliary duct into the portal vein. The dilator was then withdrawn to the margin of the liver and Gel-Foam slurry was utilized to include the initial puncture tract.  Repeat puncture into the right lobe of the liver was then performed twice with subsequent access into a right hepatic biliary duct. The guidewire was passed into the central ductal system and over this the Accustick dilator was placed. Small amount of contrast was then injected to confirm positioning within the common hepatic duct. The patient was experiencing some nausea and for this reason aggressive attempts to cross the obstructing lesion were not performed. A J wire was then coiled within the common bile duct and an 8 [place] multipurpose drain was then placed within the common bile duct. The ductal system was then decompressed and a small amount of contrast was injected to confirm catheter position. The catheter was then affixed to the skin surface with 0-Prolene and placed to an external drainage bag. The patient tolerated the procedure well and was returned to her room in satisfactory condition. The previously placed Safe-T-Centesis catheter was removed without difficulty.  Complications: Inadvertent passage of the guidewire into  the portal vein which was remedied with embolization of the tract with Gel-Foam slurry.  FINDINGS: There is mild intrahepatic biliary ductal dilatation with marked dilatation of the common hepatic duct and common bile duct. Distal obstructive lesion is noted consistent with the patient's given clinical history of pancreatic carcinoma. Due to some nausea and discomfort aggressive attempts to cross this obstruction were not performed. This will likely be attempted in 48 hours.  IMPRESSION: Successful placement of an 82 [place] biliary drain within the common bile duct with a external drainage.  The patient will return in the short-term for a more aggressive attempt at crossing the obstructing lesion with definitive stent placement.   Electronically Signed   By: Inez Catalina M.D.   On: 10/01/2014 11:47   [place] Attempted Paracentesis ([facility] Hx)  09/02/2014   CLINICAL DATA:  Pancreatic carcinoma, ascites status post 2 L paracentesis on 08/28/2014. The patient presents for additional paracentesis, if needed.  EXAM: LIMITED ABDOMEN ULTRASOUND FOR ASCITES  TECHNIQUE: Limited ultrasound survey for ascites was performed in all four abdominal quadrants.  COMPARISON:  08/28/2014  FINDINGS: Survey of the peritoneal cavity shows a small amount of ascites around the liver and also a small amount in the left upper quadrant adjacent to the upper spleen. A trace amount of fluid is present in both lower quadrants. There was not enough remaining ascites to warrant paracentesis today.  IMPRESSION: Small amount of ascites in the peritoneal cavity. Paracentesis was not performed today.   Electronically Signed   By: Aletta Edouard M.D.   On: 09/02/2014 14:55    ASSESSMENT: Progressive stage IV pancreatic cancer. Hyperbilirubinemia.  PLAN:    1. Pancreatic cancer: Patient's symptoms and elevated bilirubin of likely related to progression of disease. Previously, patient stated she does not want any further chemotherapy, but now states  if chemotherapy will keep her alive greater than 6 months she would reconsider. Patient was informed that chemotherapy is currently not an option given her current condition but  if her performance status improved she could reconsider in the future. Patient previously said she was ready to enroll in hospice, but now is rethinking her decision. 2. Pain: Appreciate palliative care input, continue current narcotic regimen.  3. Nausea: Continue Compazine as needed. 4. Thrombocytopenia: Improved. Secondary to chemotherapy, monitor. 5. Hyperbilirubinemia: External drain in place. Likely secondary to progressive disease.   Appreciate consult, will follow.   Pancreatic cancer   Staging form: Pancreas, AJCC 7th Edition     Clinical stage from 08/24/2014: Stage III (T4, N0, M0) - Signed by Lloyd Huger, MD on 08/24/2014   Lloyd Huger, MD   10/01/2014 5:37 PM

## 2014-10-01 NOTE — Progress Notes (Signed)
   10/01/14 1556  Clinical Encounter Type  Visited With Patient and family together  Visit Type Initial;Other (Comment)  Referral From Nurse  Consult/Referral To Chaplain  Spiritual Encounters  Spiritual Needs Literature;Brochure  Stress Factors  Patient Stress Factors None identified  Chaplain received order that patient was interested in completing an AD. Took patient information and explained the HCPOA/LW process.Patient will review and let us know if she wants to complete while hospitalized. Chaplain Meda Dudzinski A. Taitum Menton A. Naziah Weckerly Ext. [phone]

## 2014-10-01 NOTE — Progress Notes (Signed)
Patient ID: Tiffany Gamble, female   DOB: Nov 24, 1945, 69 y.o.   MRN: [id number] Patient seen at bedside following PTC with external drain.  Drain functioning well with good bile output.  No significant blood in bile at this time.   Planning for internalization on 5-27.  Patient advised of that plan.  Will continue to follow.

## 2014-10-01 NOTE — Plan of Care (Signed)
Problem: Discharge Progression Outcomes Goal: Discharge plan in place and appropriate Individualization of care: - Likes to be called Tiffany Gamble - Has history of pancreatic ca, receiving chemo, GRED and COPD, controlled by medications.    Goal: Other Discharge Outcomes/Goals Plan of care progress to goal for: Syncope - No syncope episodes this shift. - Continues on ABX. - Complained of pain, morphine given with improvement. - Possible stent placement today, NPO maintained.

## 2014-10-01 NOTE — Plan of Care (Signed)
Problem: Discharge Progression Outcomes Goal: Pain controlled with appropriate interventions Outcome: Progressing  Giving patient roxicodone instead of morphine to see if pain is tolerable without IV pain medications. Pt may discharge tomorrow if LFT levels are down.

## 2014-10-01 NOTE — Progress Notes (Signed)
Subjective: Pt has some discomfort at RUQ from drain placement.  Denies nausea or vomiting.  Objective: Vital signs in last 24 hours: Temp:  [97.8 F (36.6 C)-98.1 F (36.7 C)] 97.8 F (36.6 C) (05/25 0908) Pulse Rate:  [83-96] 90 (05/25 1129) Resp:  [12-27] 18 (05/25 1129) BP: (85-131)/(58-84) 122/77 mmHg (05/25 1129) SpO2:  [93 %-99 %] 94 % (05/25 1129) Last BM Date: 09/30/14 No LMP recorded. Patient has had a hysterectomy. Body mass index is 38.09 kg/(m^2). General:   Alert,  pleasant and cooperative in NAD.  Family at bedside. Head:  Normocephalic and atraumatic. Eyes:  +jaundice.  Conjunctiva pale. Mouth:  No deformity or lesions, oropharynx pink & moist. Neck:  Supple; no masses or thyromegaly. Heart:  Regular rate and rhythm; no murmurs, clicks, rubs, or gallops. Abdomen:   Normal bowel sounds.  Soft, nontender and nondistended. No masses, hepatosplenomegaly or hernias noted.  No guarding or rebound tenderness.   Msk:  Symmetrical without gross deformities. Good equal movement & strength bilaterally. Pulses:  Normal pulses noted. Extremities:  With clubbing.  Trace LEE bilat.  No cyanosis Neurologic:  Alert and  oriented x3;  grossly normal neurologically. Skin:  Intact without significant lesions or rashes. Cervical Nodes:  No significant cervical adenopathy. Psych:  Alert and cooperative. Normal mood and affect.  Intake/Output from previous day: 05/24 0701 - 05/25 0700 In: 640 [P.O.:240; I.V.:400] Out: 250 [Drains:250]  Lab Results:  Recent Labs  09/28/14 1803 09/29/14 0431 10/01/14 0405  WBC 5.7 5.2 4.0  HGB 11.5* 10.4* 11.0*  HCT 34.4* 31.2* 33.4*  PLT 116* 104* 127*   BMET  Recent Labs  09/29/14 0431 09/30/14 0449 10/01/14 0405  NA 133* 135 134*  K 3.3* 3.7 3.3*  CL 108 107 107  CO2 20* 22 21*  GLUCOSE 88 98 112*  BUN 8 8 6   CREATININE 0.33* 0.37* 0.31*  CALCIUM 7.7* 8.3* 8.3*   LFT  Recent Labs  09/29/14 0431 09/30/14 0449  10/01/14 0405  PROT 5.1* 5.3* 5.3*  ALBUMIN 2.0* 2.0* 2.1*  AST 56* 52* 62*  ALT 40 38 40  ALKPHOS 403* 374* 455*  BILITOT 9.2* 10.6* 11.6*  LIPASE  --  37  --   AMYLASE  --  11*  --    PT/INR  Recent Labs  09/30/14 1325  LABPROT 16.0*  INR 1.26   Studies/Results: US Paracentesis  09/30/2014   CLINICAL DATA:  69 year old female with a history of pancreatic carcinoma, now with biliary obstruction secondary to pancreatic head mass.  A percutaneous transhepatic cholangiogram with drainage is indicated given her hyperbilirubinemia, and the ascites increases risk factors for percutaneous access.  She has been referred for image guided therapeutic, diagnostic, and preprocedure drainage.  EXAM: ULTRASOUND GUIDED  PARACENTESIS  COMPARISON:  None.  PROCEDURE: An ultrasound guided paracentesis was thoroughly discussed with the patient and questions answered. The benefits, risks, alternatives and complications were also discussed. The patient understands and wishes to proceed with the procedure. Written consent was obtained.  Ultrasound was performed to localize and mark an adequate pocket of fluid in the left lower quadrant of the abdomen. The area was then prepped and draped in the normal sterile fashion. 1% Lidocaine was used for local anesthesia. Under ultrasound guidance a Safe-T-Centesis catheter was introduced. Paracentesis was performed.  After removal of fluid, the Safe-T-Centesis catheter was maintained, for intermittent drainage over night. This will be to evacuate as much fluid as possible from the abdomen given her pending percutaneous  transhepatic cholangiogram and drainage.  The patient tolerated the procedure well and remained hemodynamically stable throughout.  No complications were encountered and no significant blood loss was encountered.  COMPLICATIONS: None.  FINDINGS: A total of approximately 2.4 L of amber ascitic fluid was removed. A fluid sample was sent for laboratory analysis.   IMPRESSION: Status post placement of Safe-T-Centesis catheter into the left lower quadrant for drainage of the abdomen. Fluid was sent to the lab for complete analysis.  The catheter will remain overnight pending the patient's upcoming percutaneous transhepatic cholangiogram.  Signed,  Dulcy Fanny. Earleen Newport DO  [facility]  [facility]  PLAN: The drainage catheter will remain overnight.  Intermittent drainage to evacuate as much fluid as possible from the peritoneal space.  The catheter may be removed after the percutaneous transhepatic cholangiogram on the following day.   Electronically Signed   By: Corrie Mckusick D.O.   On: 09/30/2014 13:47   Ir Percutaneous Transhepatic Cholangiogram  10/01/2014   Inez Catalina, MD     10/01/2014 11:13 AM Cholangiogram with 66F drain   Complications:  None  Blood Loss: none  See dictation in canopy pacs   Ir Biliary Drain Placement With Cholangiogram  10/01/2014   CLINICAL DATA:  Known pancreatic carcinoma with common bile duct obstruction  EXAM: IR BILIARY DRAIN PLACEMENT W/ CHOLANGIOGRAM  MEDICATIONS: 2 mg IV Versed , 100 mcg IV Fentanyl, 20 mL 1% lidocaine. The sedation time was 1 hour  CONTRAST:  53mL OMNIPAQUE IOHEXOL 300 MG/ML  SOLN  FLUOROSCOPY TIME:  Radiation Exposure Index (as provided by the fluoroscopic device): Not available  If the device does not provide the exposure index:  Fluoroscopy Time (in minutes and seconds):  14 minutes 30 seconds  Number of Acquired Images:  5  PROCEDURE: The patient was advised of the possible risks and complications and agreed to undergo the procedure. Following an appropriate time-out the patient was prepped and draped in the standard sterile manner along the right mid axillary line. 1% lidocaine was utilized for local anesthesia.  Utilizing fluoroscopic guidance, a 20 gauge diamond tip needle was placed into the right lobe of the liver. The approximately 6 passes were made prior to gaining  access to a right hepatic biliary duct. Contrast was injected and within a wire was placed and coursed ports the central common hepatic duct. The tract was then dilated with a 6 [place] Accustick dilator however upon removing the central portion of the dilator it was found that the wire had passed through the biliary duct into the portal vein. The dilator was then withdrawn to the margin of the liver and Gel-Foam slurry was utilized to include the initial puncture tract.  Repeat puncture into the right lobe of the liver was then performed twice with subsequent access into a right hepatic biliary duct. The guidewire was passed into the central ductal system and over this the Accustick dilator was placed. Small amount of contrast was then injected to confirm positioning within the common hepatic duct. The patient was experiencing some nausea and for this reason aggressive attempts to cross the obstructing lesion were not performed. A J wire was then coiled within the common bile duct and an 8 [place] multipurpose drain was then placed within the common bile duct. The ductal system was then decompressed and a small amount of contrast was injected to confirm catheter position. The catheter was then affixed to the skin surface with 0-Prolene and placed to an external drainage  bag. The patient tolerated the procedure well and was returned to her room in satisfactory condition. The previously placed Safe-T-Centesis catheter was removed without difficulty.  Complications: Inadvertent passage of the guidewire into the portal vein which was remedied with embolization of the tract with Gel-Foam slurry.  FINDINGS: There is mild intrahepatic biliary ductal dilatation with marked dilatation of the common hepatic duct and common bile duct. Distal obstructive lesion is noted consistent with the patient's given clinical history of pancreatic carcinoma. Due to some nausea and discomfort aggressive attempts to cross this obstruction  were not performed. This will likely be attempted in 48 hours.  IMPRESSION: Successful placement of an 51 [place] biliary drain within the common bile duct with a external drainage.  The patient will return in the short-term for a more aggressive attempt at crossing the obstructing lesion with definitive stent placement.   Electronically Signed   By: Inez Catalina M.D.   On: 10/01/2014 11:47    Assessment: Obstructive Jaundice secondary to pancreatic CA:  S/p temporary biliary drain by IR.  Pt opting for palliative care measures only at this time.  Plan: Continue supportive measures Will Sign off.  Call if any further problems or questions.   We would like to thank you for the opportunity to participate in the care of Tiffany Gamble.     LOS: 3 days  Vickey Huger  10/01/2014, 12:39 PM [facility]  [address], [place] [address] Phone: [phone] Fax : [phone]

## 2014-10-01 NOTE — Care Management (Signed)
Met with patient to discuss hospice agencies. Presented hospice agencies and she selected [facility]. [name] with [facility] hospice notified. Patient plans to return home with her boyfriend at discharge. She has supportive family.

## 2014-10-01 NOTE — Procedures (Signed)
Cholangiogram with 56F drain   Complications:  None  Blood Loss: none  See dictation in canopy pacs

## 2014-10-01 NOTE — Progress Notes (Signed)
Notified by [facility], of new referral for [facility] [place] services after discharge. Writer met with patient, her sisters Massie Maroon and Arville Go as well her boyfriend and primary caregiver Darryl. Patient was sleepy, but interactive. Hospice services presented, including team approach and philosophy of care. Patient and her boyfriend at this time feel that they do not need hospice services. Darryl states" I can do everything for her". Information and contact number left with them. Jeneen Rinks, Palliative Medicine NP and CMRN Lev[facility] Dy made aware. Thank you. [name] RN, BSN, [place] [facility] [place], [facility] [phone] c

## 2014-10-01 NOTE — Progress Notes (Signed)
[facility] [place] NAME: Tiffany Gamble    MR#:  [id number]  DATE OF BIRTH:  1945/08/17  SUBJECTIVE:  CHIEF COMPLAINT:   Chief Complaint  Patient presents with  . Stroke Symptoms   patient very tired and complains of right upper quadrant pain. She wants to eat something. Just had percutaneous transhepatic biliary drain placed in. A stent couldn't be put in because of dilated ducts today.  REVIEW OF SYSTEMS:  ROS   Constitutional: Positive for malaise/fatigue. Negative for fever and chills.  Eyes:   Icteric sclerae  Respiratory: Negative for cough, shortness of breath and wheezing.  Cardiovascular: Negative for chest pain and palpitations.  Gastrointestinal: Positive for nausea and abdominal pain. Negative for vomiting, diarrhea and constipation.   Abdominal distention, ascites.  Genitourinary: Negative for dysuria.  Musculoskeletal: Positive for back pain.   Chronic pain  Skin:   Jaundiced  Neurological: Positive for weakness. Negative for dizziness, seizures and headaches.   DRUG ALLERGIES:   Allergies  Allergen Reactions  . Acetaminophen Other (See Comments)    Other reaction(s): Other (qualifier value) Liver problems Affects liver.  . Clotrimazole     Other reaction(s): Unknown Pt. does not know  . Codeine Hives    Other reaction(s): Weal  . Fluticasone Furoate-Vilanterol Other (See Comments)    thrush  . Hydrocodone-Acetaminophen Itching    Other reaction(s): Itching of Skin, Weal itching  . Ibuprofen Other (See Comments)    alters liver function Other reaction(s): Other (qualifier value) liver problems Affects liver.  . Prednisone Other (See Comments) and Swelling    Throat swelling  . Penicillin G Rash  . Penicillins Rash    VITALS:  Blood pressure 122/77, pulse 90, temperature 97.8 F (36.6 C), temperature source Oral, resp. rate 18, height 5\' 1"  (1.549 m), weight 91.4 kg  (201 lb 8 oz), SpO2 94 %.  PHYSICAL EXAMINATION:  Physical Exam  Constitutional: She is oriented to person, place, and time. No distress.  HENT:  Head: Normocephalic and atraumatic.  Eyes: Conjunctivae are normal. Pupils are equal, round, and reactive to light.  Neck: Normal range of motion. Neck supple. No JVD present. No thyromegaly present.  Cardiovascular: Normal rate and regular rhythm.   Murmur heard. Pulmonary/Chest: No stridor. No respiratory distress. She has no wheezes. She has no rales. She exhibits no tenderness.  Abdominal: She exhibits distension. There is tenderness.  Ascites present. Hypoactive bowel sounds. Worsened abdominal distention. Biliary drain from RUQ present  Musculoskeletal: She exhibits edema.  Neurological: She is alert and oriented to person, place, and time. No cranial nerve deficit. She exhibits normal muscle tone. GCS score is 15.  Skin: No rash noted. She is not diaphoretic. No erythema.  Psychiatric:  Irritable mood. Normal memory.     LABORATORY PANEL:   CBC  Recent Labs Lab 10/01/14 0405  WBC 4.0  HGB 11.0*  HCT 33.4*  PLT 127*   ------------------------------------------------------------------------------------------------------------------  Chemistries   Recent Labs Lab 10/01/14 0405  NA 134*  K 3.3*  CL 107  CO2 21*  GLUCOSE 112*  BUN 6  CREATININE 0.31*  CALCIUM 8.3*  AST 62*  ALT 40  ALKPHOS 455*  BILITOT 11.6*   ------------------------------------------------------------------------------------------------------------------  Cardiac Enzymes  Recent Labs Lab 09/28/14 1803  TROPONINI <0.03   ------------------------------------------------------------------------------------------------------------------  RADIOLOGY:  US Paracentesis  09/30/2014   CLINICAL DATA:  69 year old female with a history of pancreatic carcinoma, now with biliary obstruction secondary to pancreatic head  mass.  A percutaneous  transhepatic cholangiogram with drainage is indicated given her hyperbilirubinemia, and the ascites increases risk factors for percutaneous access.  She has been referred for image guided therapeutic, diagnostic, and preprocedure drainage.  EXAM: ULTRASOUND GUIDED  PARACENTESIS  COMPARISON:  None.  PROCEDURE: An ultrasound guided paracentesis was thoroughly discussed with the patient and questions answered. The benefits, risks, alternatives and complications were also discussed. The patient understands and wishes to proceed with the procedure. Written consent was obtained.  Ultrasound was performed to localize and mark an adequate pocket of fluid in the left lower quadrant of the abdomen. The area was then prepped and draped in the normal sterile fashion. 1% Lidocaine was used for local anesthesia. Under ultrasound guidance a Safe-T-Centesis catheter was introduced. Paracentesis was performed.  After removal of fluid, the Safe-T-Centesis catheter was maintained, for intermittent drainage over night. This will be to evacuate as much fluid as possible from the abdomen given her pending percutaneous transhepatic cholangiogram and drainage.  The patient tolerated the procedure well and remained hemodynamically stable throughout.  No complications were encountered and no significant blood loss was encountered.  COMPLICATIONS: None.  FINDINGS: A total of approximately 2.4 L of amber ascitic fluid was removed. A fluid sample was sent for laboratory analysis.  IMPRESSION: Status post placement of Safe-T-Centesis catheter into the left lower quadrant for drainage of the abdomen. Fluid was sent to the lab for complete analysis.  The catheter will remain overnight pending the patient's upcoming percutaneous transhepatic cholangiogram.  Signed,  Dulcy Fanny. Earleen Newport DO  [facility]  [facility]  PLAN: The drainage catheter will remain overnight.  Intermittent drainage to evacuate as  much fluid as possible from the peritoneal space.  The catheter may be removed after the percutaneous transhepatic cholangiogram on the following day.   Electronically Signed   By: Corrie Mckusick D.O.   On: 09/30/2014 13:47   Ir Percutaneous Transhepatic Cholangiogram  10/01/2014   Inez Catalina, MD     10/01/2014 11:13 AM Cholangiogram with 8F drain   Complications:  None  Blood Loss: none  See dictation in canopy pacs   Ir Biliary Drain Placement With Cholangiogram  10/01/2014   CLINICAL DATA:  Known pancreatic carcinoma with common bile duct obstruction  EXAM: IR BILIARY DRAIN PLACEMENT W/ CHOLANGIOGRAM  MEDICATIONS: 2 mg IV Versed , 100 mcg IV Fentanyl, 20 mL 1% lidocaine. The sedation time was 1 hour  CONTRAST:  41mL OMNIPAQUE IOHEXOL 300 MG/ML  SOLN  FLUOROSCOPY TIME:  Radiation Exposure Index (as provided by the fluoroscopic device): Not available  If the device does not provide the exposure index:  Fluoroscopy Time (in minutes and seconds):  14 minutes 30 seconds  Number of Acquired Images:  5  PROCEDURE: The patient was advised of the possible risks and complications and agreed to undergo the procedure. Following an appropriate time-out the patient was prepped and draped in the standard sterile manner along the right mid axillary line. 1% lidocaine was utilized for local anesthesia.  Utilizing fluoroscopic guidance, a 20 gauge diamond tip needle was placed into the right lobe of the liver. The approximately 6 passes were made prior to gaining access to a right hepatic biliary duct. Contrast was injected and within a wire was placed and coursed ports the central common hepatic duct. The tract was then dilated with a 6 [place] Accustick dilator however upon removing the central portion of the dilator it was found that the wire had  passed through the biliary duct into the portal vein. The dilator was then withdrawn to the margin of the liver and Gel-Foam slurry was utilized to include the initial puncture  tract.  Repeat puncture into the right lobe of the liver was then performed twice with subsequent access into a right hepatic biliary duct. The guidewire was passed into the central ductal system and over this the Accustick dilator was placed. Small amount of contrast was then injected to confirm positioning within the common hepatic duct. The patient was experiencing some nausea and for this reason aggressive attempts to cross the obstructing lesion were not performed. A J wire was then coiled within the common bile duct and an 8 [place] multipurpose drain was then placed within the common bile duct. The ductal system was then decompressed and a small amount of contrast was injected to confirm catheter position. The catheter was then affixed to the skin surface with 0-Prolene and placed to an external drainage bag. The patient tolerated the procedure well and was returned to her room in satisfactory condition. The previously placed Safe-T-Centesis catheter was removed without difficulty.  Complications: Inadvertent passage of the guidewire into the portal vein which was remedied with embolization of the tract with Gel-Foam slurry.  FINDINGS: There is mild intrahepatic biliary ductal dilatation with marked dilatation of the common hepatic duct and common bile duct. Distal obstructive lesion is noted consistent with the patient's given clinical history of pancreatic carcinoma. Due to some nausea and discomfort aggressive attempts to cross this obstruction were not performed. This will likely be attempted in 48 hours.  IMPRESSION: Successful placement of an 73 [place] biliary drain within the common bile duct with a external drainage.  The patient will return in the short-term for a more aggressive attempt at crossing the obstructing lesion with definitive stent placement.   Electronically Signed   By: Inez Catalina M.D.   On: 10/01/2014 11:47    EKG:   Orders placed or performed during the hospital encounter of  09/28/14  . EKG  . EKG    ASSESSMENT AND PLAN:   69 year old female with a past medical history significant for recent diagnosis of pancreatic cancer currently on chemotherapy, history of gastric bypass surgery, colonic polyps in the past, ovarian cancer status post surgical treatment, COPD and gastroesophageal reflux disease and ongoing ascites presents to the hospital secondary to weakness and a syncopal episode. She was noted to have worsening of her obstructive jaundice.  #1 obstructive jaundice-rapid worsening within 2 weeks.  - In light of her pancreatic cancer. GI has been consulted. ERCP attempted, however was unsuccessful due to her bypass surgery history.  - Ascites drained yesterday and transhepatic percutaneous biliary drain placed in today. Appreciate interventional radiology consult  - After decompression through The biliary drain, IR will go in again to put in a stent. If it can be done on Friday we'll keep her here in the hospital. If it has to be done on Monday will discharge her if she is stable and she can get it done as an outpatient.  #2 pancreatic cancer-follows with Dr. Grayland Ormond.  Likely with worsening jaundice it could be worsening of her tumor. She did finish 3-4 cycles of chemotherapy and wanted to stop because of her side effects. They were planning to rediscuss about reinitiation of treatment versus hospice services next month. Dr. Grayland Ormond has been consulted. Palliative care consult today.  #3 COPD-not in any acute exacerbation. Does have scattered wheeze. Continue Spiriva and added  Symbicort. No indication for systemic steroids.  #4 chronic pain-continue her pain medications home dose. She follows up with the pain management physician and is on a pain contract. On fentanyl patch and oxycodone  #5 DVT prophylaxis-Ted's and SCDs.  Palliative care consult today  All the records are reviewed and case discussed with Care Management/Social Workerr. Management plans  discussed with the patient, family and they are in agreement.  CODE STATUS: Full code  TOTAL TIME TAKING CARE OF THIS PATIENT: 40 minutes.   POSSIBLE D/C IN 2-3 DAYS, DEPENDING ON CLINICAL CONDITION.   Gladstone Lighter M.D on 10/01/2014 at 11:51 AM  Between 7am to 6pm - Pager - [phone]  After 6pm go to [email or web address] - password [email or web address] [place] [facility]  Office  [phone]  CC: Primary care physician; LADA, Satira Anis, MD

## 2014-10-01 NOTE — Consult Note (Signed)
Palliative Medicine Inpatient Consult Note   Name: Tiffany Gamble Date: 10/01/2014 MRN: [id number]  DOB: [birth year]  Referring Physician: Gladstone Lighter, MD  Palliative Care consult requested for this 69 y.o. female for goals of medical therapy in patient with pancreatic cancer, COPD, HTN, and h/o lupus.  Pt presented to ER from home due to syncope.  ER workup significant for hyperbilirubinemia.  Pt admitted for evaluation and treatment.  Family at bedside.  Pt resting in bed and eating a clears diet.  Pt states RUQ abdominal pain.  No other complaints or concerns.   REVIEW OF SYSTEMS:  Pain: Moderate Dyspnea:  No Nausea/Vomiting:  No Fatigue:   Yes All other systems were reviewed and found to be negative  SPIRITUAL SUPPORT SYSTEM: Yes.  SOCIAL HISTORY:Pt lives at home with boyfriend.  Pt has 3 children and two sisters.  reports that she has been smoking Cigarettes.  She has a 50 pack-year smoking history. She has never used smokeless tobacco. She reports that she does not drink alcohol or use illicit drugs.  LEGAL DOCUMENTS:  Advance Directives:  Yes.  CODE STATUS: Full code  PAST MEDICAL HISTORY: Past Medical History  Diagnosis Date  . Lupus   . Staph infection     MRSA  . Arthritis   . Cancer 2016    pancreas  . Ovarian cancer 2011    left    PAST SURGICAL HISTORY:  Past Surgical History  Procedure Laterality Date  . Tonsillectomy    . Appendectomy    . Hand surgery Bilateral   . Knee surgery Right   . Partial hysterectomy    . Tubal ligation    . Ventral hernia repair  03-17-10, 11-24-10    Dr Bary Castilla  . Umbilical hernia repair  02-09-11    and ventral by Dr Bary Castilla  . Laparoscopic lysis intestinal adhesions  03-09-10    Dr Bary Castilla  . Incision and drainage  09-17-10    with wound vac Dr Bary Castilla  . Abd wall hernia  09-07-10     Dr. Bary Castilla  . Hernia repair  2014    last surgery was Dr. Duke Salvia  . Gastric bypass    . Ercp N/A 09/29/2014    Procedure:  ENDOSCOPIC RETROGRADE CHOLANGIOPANCREATOGRAPHY (ERCP);  Surgeon: Lucilla Lame, MD;  Location: [facility] ENDOSCOPY;  Service: Endoscopy;  Laterality: N/A;    ALLERGIES:  is allergic to acetaminophen; clotrimazole; codeine; fluticasone furoate-vilanterol; hydrocodone-acetaminophen; ibuprofen; prednisone; penicillin g; and penicillins.  MEDICATIONS:  Current Facility-Administered Medications  Medication Dose Route Frequency Provider Last Rate Last Dose  . 0.9 %  sodium chloride infusion   Intravenous Continuous Inez Catalina, MD 10 mL/hr at 10/01/14 [phone]    . budesonide-formoterol (SYMBICORT) 160-4.5 MCG/ACT inhaler 2 puff  2 puff Inhalation BID Tiffany Lighter, MD   2 puff at 09/30/14 2103  . cyclobenzaprine (FLEXERIL) tablet 10 mg  10 mg Oral BID Lytle Butte, MD   10 mg at 10/01/14 1211  . donepezil (ARICEPT) tablet 5 mg  5 mg Oral QHS Lytle Butte, MD   5 mg at 09/30/14 2103  . fentaNYL (DURAGESIC - dosed mcg/hr) 75 mcg  75 mcg Transdermal Q72H Judith Part Lark Langenfeld, NP      . fentaNYL (SUBLIMAZE) 100 MCG/2ML injection           . fluticasone (FLONASE) 50 MCG/ACT nasal spray 2 spray  2 spray Each Nare Daily Lytle Butte, MD   2 spray at 10/01/14 1211  . midazolam (  VERSED) 5 MG/5ML injection           . nicotine (NICOTROL) 10 MG inhaler 1 continuous puffing  1 continuous puffing Inhalation PRN Lytle Butte, MD   1 continuous puffing at 09/30/14 2311  . ondansetron (ZOFRAN) tablet 4 mg  4 mg Oral Q6H PRN Lytle Butte, MD       Or  . ondansetron [facility]) injection 4 mg  4 mg Intravenous Q6H PRN Lytle Butte, MD      . oxyCODONE (Oxy IR/ROXICODONE) immediate release tablet 20 mg  20 mg Oral QID PRN Lytle Butte, MD   20 mg at 10/01/14 1210  . pantoprazole (PROTONIX) EC tablet 40 mg  40 mg Oral BID Lytle Butte, MD   40 mg at 09/30/14 2103  . potassium chloride SA (K-DUR,KLOR-CON) CR tablet 40 mEq  40 mEq Oral Once Tiffany Lighter, MD      . tiotropium [facility]) inhalation capsule 18 mcg  18  mcg Inhalation Daily Lytle Butte, MD   18 mcg at 10/01/14 1211  . ursodiol (ACTIGALL) capsule 300 mg  300 mg Oral Daily Lytle Butte, MD   300 mg at 09/30/14 1018   Facility-Administered Medications Ordered in Other Encounters  Medication Dose Route Frequency Provider Last Rate Last Dose  . sodium chloride 0.9 % injection 10 mL  10 mL Intravenous PRN Lloyd Huger, MD   10 mL at 09/10/14 1030    Vital Signs: BP 106/75 mmHg  Pulse 97  Temp(Src) 97.7 F (36.5 C) (Oral)  Resp 18  Ht 5\' 1"  (1.549 m)  Wt 91.4 kg (201 lb 8 oz)  BMI 38.09 kg/m2  SpO2 94% Filed Weights   09/28/14 1640 09/30/14 0501  Weight: 83.462 kg (184 lb) 91.4 kg (201 lb 8 oz)    Estimated body mass index is 38.09 kg/(m^2) as calculated from the following:   Height as of this encounter: 5\' 1"  (1.549 m).   Weight as of this encounter: 91.4 kg (201 lb 8 oz).  PERFORMANCE STATUS (ECOG) : 1 - Symptomatic but completely ambulatory  PHYSICAL EXAM: General: ill appearing HEENT: OP clear, moist oral mucosa, sclera yellow Neck: Trachea midline  Cardiovascular: RRR Pulmonary/Chest: Clear ant fields Abdominal: Soft, NTTP. Hypoactive bowel sounds Extremities: No edema  Neurological: Grossly nonfocal Skin: Warm, dry and yellow Psychiatric: Calm, A&O x 3  LABS: CBC:    Component Value Date/Time   WBC 4.0 10/01/2014 0405   WBC 1.5* 09/01/2014 1959   HGB 11.0* 10/01/2014 0405   HGB 9.5* 09/01/2014 1959   HCT 33.4* 10/01/2014 0405   HCT 28.5* 09/01/2014 1959   PLT 127* 10/01/2014 0405   PLT 97* 09/01/2014 1959   MCV 94.4 10/01/2014 0405   MCV 91 09/01/2014 1959   NEUTROABS 3.1 09/17/2014 0904   NEUTROABS 2.7 08/27/2014 0907   LYMPHSABS 0.6* 09/17/2014 0904   LYMPHSABS 0.6* 08/27/2014 0907   MONOABS 0.4 09/17/2014 0904   MONOABS 0.3 08/27/2014 0907   EOSABS 0.0 09/17/2014 0904   EOSABS 0.1 08/27/2014 0907   BASOSABS 0.0 09/17/2014 0904   BASOSABS 0.0 08/27/2014 0907   Comprehensive Metabolic  Panel:    Component Value Date/Time   NA 134* 10/01/2014 0405   NA 132* 09/01/2014 1959   K 3.3* 10/01/2014 0405   K 3.4* 09/01/2014 1959   CL 107 10/01/2014 0405   CL 100* 09/01/2014 1959   CO2 21* 10/01/2014 0405   CO2 27 09/01/2014 1959  BUN 6 10/01/2014 0405   BUN 8 09/01/2014 1959   CREATININE 0.31* 10/01/2014 0405   CREATININE 0.53 09/01/2014 1959   GLUCOSE 112* 10/01/2014 0405   GLUCOSE 107* 09/01/2014 1959   CALCIUM 8.3* 10/01/2014 0405   CALCIUM 8.0* 09/01/2014 1959   AST 62* 10/01/2014 0405   AST 42* 09/01/2014 1959   ALT 40 10/01/2014 0405   ALT 35 09/01/2014 1959   ALKPHOS 455* 10/01/2014 0405   ALKPHOS 325* 09/01/2014 1959   BILITOT 11.6* 10/01/2014 0405   PROT 5.3* 10/01/2014 0405   PROT 5.6* 09/01/2014 1959   ALBUMIN 2.1* 10/01/2014 0405   ALBUMIN 2.3* 09/01/2014 1959    IMPRESSION:  Mrs. Whitehair is a 68 y.o. female with pancreatic cancer, COPD, HTN, h/o gastric bypass. and h/o lupus.  Pt presented to ER from home due to syncope.  ER workup significant for hyperbilirubinemia.  Pt admitted for evaluation and treatment.  Family at bedside.  Pt has needed 90 oral morphine equivalents in last 24 hours. Pt jaundice appearing upon presentation today.   Pt agrees to goals of care conversation. Spoke with pt about her pain management and she states Dr. Grayland Ormond prescribes her pain medication.  Pt agrees to increase Fentanyl patch to 42mcg and continue Oxycodone 20mg  Q6H prn only for prn management.  Spoke with pt about future chemotherapy treatments for pancreatic cancer and pt states "I'm done".  Pt states she would like hospice services at home.  Pt confirms she is a DNR and has an advanced directive.  Pt does not have a HCPOA and would like to name son.    Pt acknowledges she is to have attempt at biliary stent placement on Friday.  Pt very appreciative of palliative input.    PLAN: 1. Fentanyl 3mcg transdermal Q3days 2. Continue oxycodone prn 3. Hospice  screen 4. Chaplain consult for HCPOA 5. Update Dr. Grayland Ormond about pain management  REFERRALS TO BE ORDERED:  See above   More than 50% of the visit was spent in counseling/coordination of care: Yes  Time Spent: 85 minutes

## 2014-10-01 NOTE — Plan of Care (Signed)
Problem: Discharge Progression Outcomes Goal: Tolerating diet Outcome: Progressing Started on a full liquid diet and advancing as tolerated.

## 2014-10-02 LAB — COMPREHENSIVE METABOLIC PANEL
ALBUMIN: 2 g/dL — AB (ref 3.5–5.0)
ALK PHOS: 426 U/L — AB (ref 38–126)
ALT: 38 U/L (ref 14–54)
AST: 44 U/L — AB (ref 15–41)
Anion gap: 7 (ref 5–15)
BUN: 7 mg/dL (ref 6–20)
CALCIUM: 8.3 mg/dL — AB (ref 8.9–10.3)
CO2: 21 mmol/L — ABNORMAL LOW (ref 22–32)
Chloride: 103 mmol/L (ref 101–111)
Creatinine, Ser: 0.57 mg/dL (ref 0.44–1.00)
Glucose, Bld: 113 mg/dL — ABNORMAL HIGH (ref 65–99)
Potassium: 3.8 mmol/L (ref 3.5–5.1)
Sodium: 131 mmol/L — ABNORMAL LOW (ref 135–145)
TOTAL PROTEIN: 5.1 g/dL — AB (ref 6.5–8.1)
Total Bilirubin: 7.8 mg/dL — ABNORMAL HIGH (ref 0.3–1.2)

## 2014-10-02 LAB — PATHOLOGIST SMEAR REVIEW

## 2014-10-02 LAB — CYTOLOGY - NON PAP

## 2014-10-02 MED ORDER — URSODIOL 300 MG PO CAPS
300.0000 mg | ORAL_CAPSULE | Freq: Three times a day (TID) | ORAL | Status: DC
  Administered 2014-10-02 – 2014-10-04 (×7): 300 mg via ORAL
  Filled 2014-10-02 (×12): qty 1

## 2014-10-02 MED ORDER — ALBUTEROL SULFATE HFA 108 (90 BASE) MCG/ACT IN AERS: 2.0000 | INHALATION_SPRAY | Freq: Four times a day (QID) | RESPIRATORY_TRACT | Status: DC

## 2014-10-02 MED ORDER — PROCHLORPERAZINE MALEATE 10 MG PO TABS
10.0000 mg | ORAL_TABLET | Freq: Three times a day (TID) | ORAL | Status: AC
Start: 2014-10-02 — End: 2014-10-04
  Administered 2014-10-02 – 2014-10-04 (×7): 10 mg via ORAL
  Filled 2014-10-02 (×7): qty 1

## 2014-10-02 MED ORDER — ALBUTEROL SULFATE (2.5 MG/3ML) 0.083% IN NEBU
2.5000 mg | INHALATION_SOLUTION | Freq: Four times a day (QID) | RESPIRATORY_TRACT | Status: DC
  Administered 2014-10-02 – 2014-10-04 (×7): 2.5 mg via RESPIRATORY_TRACT
  Filled 2014-10-02 (×7): qty 3

## 2014-10-02 NOTE — Plan of Care (Signed)
Problem: Discharge Progression Outcomes Goal: Discharge plan in place and appropriate Outcome: Progressing Individualization of care: - Likes to be called Hassan Rowan - Has history of pancreatic ca, receiving chemo, GRED and COPD, controlled by medications. Goal: Other Discharge Outcomes/Goals Outcome: Progressing Plan of care progress to goal for: Pain-pt c/o pain, prn meds given with improvement Hemodynamically-VSS Complications-no c/o this shift Diet-pt tolerating diet this shift Activity-pt not out of bed this shift

## 2014-10-02 NOTE — Plan of Care (Signed)
Problem: Discharge Progression Outcomes Goal: Other Discharge Outcomes/Goals Outcome: Progressing Patient alert and oriented, denies pain, vital signs stable.  May be going for stent placement tomorrow.  Continues to have biliary tube with leg bag, no changes with it.  Will continue to update today.

## 2014-10-02 NOTE — Consult Note (Signed)
Palliative Medicine Inpatient Consult Follow Up Note   Name: Tiffany Gamble Date: 10/02/2014 MRN: [id number]  DOB: 1945/05/14  Referring Physician: Gladstone Lighter, MD  Palliative Care consult requested for this 69 y.o. female for goals of medical therapy in patient with with pancreatic cancer, COPD, HTN, and h/o lupus. Pt presented to ER from home due to syncope. ER workup significant for hyperbilirubinemia. Pt admitted for evaluation and treatment. Family at bedside.  Pt resting in bed comfortably.  Pt has no complaints or concerns and is eating breakfast.    REVIEW OF SYSTEMS:  Pain: Moderate Dyspnea: No Nausea/Vomiting: No Fatigue: Yes All other systems were reviewed and found to be negative  CODE STATUS: DNR   PAST MEDICAL HISTORY: Past Medical History  Diagnosis Date  . Lupus   . Staph infection     MRSA  . Arthritis   . Cancer 2016    pancreas  . Ovarian cancer 2011    left    PAST SURGICAL HISTORY:  Past Surgical History  Procedure Laterality Date  . Tonsillectomy    . Appendectomy    . Hand surgery Bilateral   . Knee surgery Right   . Partial hysterectomy    . Tubal ligation    . Ventral hernia repair  03-17-10, 11-24-10    Dr Tiffany Gamble  . Umbilical hernia repair  02-09-11    and ventral by Dr Tiffany Gamble  . Laparoscopic lysis intestinal adhesions  03-09-10    Dr Tiffany Gamble  . Incision and drainage  09-17-10    with wound vac Dr Tiffany Gamble  . Abd wall hernia  09-07-10     Dr. Bary Gamble  . Hernia repair  2014    last surgery was Dr. Duke Gamble  . Gastric bypass    . Ercp N/A 09/29/2014    Procedure: ENDOSCOPIC RETROGRADE CHOLANGIOPANCREATOGRAPHY (ERCP);  Surgeon: Tiffany Lame, MD;  Location: [facility] ENDOSCOPY;  Service: Endoscopy;  Laterality: N/A;    Vital Signs: BP 107/67 mmHg  Pulse 87  Temp(Src) 97.8 F (36.6 C) (Oral)  Resp 18  Ht 5\' 1"  (1.549 m)  Wt 91.4 kg (201 lb 8 oz)  BMI 38.09 kg/m2  SpO2 96% Filed Weights   09/28/14 1640 09/30/14 0501  Weight:  83.462 kg (184 lb) 91.4 kg (201 lb 8 oz)    Estimated body mass index is 38.09 kg/(m^2) as calculated from the following:   Height as of this encounter: 5\' 1"  (1.549 m).   Weight as of this encounter: 91.4 kg (201 lb 8 oz).  PHYSICAL EXAM:  General: ill appearing HEENT: OP clear, moist oral mucosa, sclera yellow Neck: Trachea midline  Cardiovascular: RRR Pulmonary/Chest: Clear ant fields Abdominal: Soft, NTTP. Hypoactive bowel sounds Extremities: No edema  Neurological: Grossly nonfocal Skin: Warm, dry and yellow Psychiatric: Calm, A&O x 3  LABS: CBC:    Component Value Date/Time   WBC 4.0 10/01/2014 0405   WBC 1.5* 09/01/2014 1959   HGB 11.0* 10/01/2014 0405   HGB 9.5* 09/01/2014 1959   HCT 33.4* 10/01/2014 0405   HCT 28.5* 09/01/2014 1959   PLT 127* 10/01/2014 0405   PLT 97* 09/01/2014 1959   MCV 94.4 10/01/2014 0405   MCV 91 09/01/2014 1959   NEUTROABS 3.1 09/17/2014 0904   NEUTROABS 2.7 08/27/2014 0907   LYMPHSABS 0.6* 09/17/2014 0904   LYMPHSABS 0.6* 08/27/2014 0907   MONOABS 0.4 09/17/2014 0904   MONOABS 0.3 08/27/2014 0907   EOSABS 0.0 09/17/2014 0904   EOSABS 0.1 08/27/2014 5638  BASOSABS 0.0 09/17/2014 0904   BASOSABS 0.0 08/27/2014 0907   Comprehensive Metabolic Panel:    Component Value Date/Time   NA 131* 10/02/2014 0410   NA 132* 09/01/2014 1959   K 3.8 10/02/2014 0410   K 3.4* 09/01/2014 1959   CL 103 10/02/2014 0410   CL 100* 09/01/2014 1959   CO2 21* 10/02/2014 0410   CO2 27 09/01/2014 1959   BUN 7 10/02/2014 0410   BUN 8 09/01/2014 1959   CREATININE 0.57 10/02/2014 0410   CREATININE 0.53 09/01/2014 1959   GLUCOSE 113* 10/02/2014 0410   GLUCOSE 107* 09/01/2014 1959   CALCIUM 8.3* 10/02/2014 0410   CALCIUM 8.0* 09/01/2014 1959   AST 44* 10/02/2014 0410   AST 42* 09/01/2014 1959   ALT 38 10/02/2014 0410   ALT 35 09/01/2014 1959   ALKPHOS 426* 10/02/2014 0410   ALKPHOS 325* 09/01/2014 1959   BILITOT 7.8* 10/02/2014 0410   PROT 5.1*  10/02/2014 0410   PROT 5.6* 09/01/2014 1959   ALBUMIN 2.0* 10/02/2014 0410   ALBUMIN 2.3* 09/01/2014 1959    IMPRESSION:  Tiffany Gamble is a 69 y.o. female with pancreatic cancer, COPD, HTN, h/o gastric bypass. and h/o lupus. Pt presented to ER from home due to syncope. ER workup significant for hyperbilirubinemia. Pt admitted for evaluation and treatment. Family at bedside.  Pt with decrease in bilirubin today.  RLQ drain producing bilious drainage. Pt currently resting in bed comfortably and eating breakfast..  Boyfriend at bedside.  Pt states she has changed her mind and would like chemotherapy if offered and does not want hospice services.  Pt declines any services at home and she states her boyfriend takes care of her.  Scenarios were given and boyfriend kept saying he could handle it.  Palliative Medicine will follow for symptom management if needed.  PLAN: 1. DNR   More than 50% of the visit was spent in counseling/coordination of care: YES  Time Spent: 35 minutes

## 2014-10-02 NOTE — Progress Notes (Signed)
[facility] [place] NAME: Tiffany Gamble    MR#:  [id number]  DATE OF BIRTH:  [birth year]  SUBJECTIVE:  CHIEF COMPLAINT:   Chief Complaint  Patient presents with  . Stroke Symptoms    Patient more perky today. Pain is very well controlled today. Caretaker at bedside and the requesting some medication changes based on home medication list. Right upper quadrant biliary drain present draining dark yellow bile in it. Patient to go for IR guided biliary stent placement tomorrow morning.  REVIEW OF SYSTEMS:  ROS   Constitutional: Positive for malaise/fatigue. Negative for fever and chills.  Eyes:   Icteric sclerae  Respiratory: Negative for cough, shortness of breath and wheezing.  Cardiovascular: Negative for chest pain and palpitations.  Gastrointestinal: Positive for nausea and abdominal pain- which are chronic. Negative for vomiting, diarrhea and constipation.   Abdominal distention, ascites.  Genitourinary: Negative for dysuria.  Musculoskeletal: Positive for back pain.   Chronic pain  Skin:   Jaundiced  Neurological: Positive for weakness. Negative for dizziness, seizures and headaches.   DRUG ALLERGIES:   Allergies  Allergen Reactions  . Acetaminophen Other (See Comments)    Other reaction(s): Other (qualifier value) Liver problems Affects liver.  . Clotrimazole     Other reaction(s): Unknown Pt. does not know  . Codeine Hives    Other reaction(s): Weal  . Fluticasone Furoate-Vilanterol Other (See Comments)    thrush  . Hydrocodone-Acetaminophen Itching    Other reaction(s): Itching of Skin, Weal itching  . Ibuprofen Other (See Comments)    alters liver function Other reaction(s): Other (qualifier value) liver problems Affects liver.  . Prednisone Other (See Comments) and Swelling    Throat swelling  . Penicillin G Rash  . Penicillins Rash    VITALS:  Blood pressure 107/67, pulse 87,  temperature 97.8 F (36.6 C), temperature source Oral, resp. rate 18, height 5\' 1"  (1.549 m), weight 91.4 kg (201 lb 8 oz), SpO2 96 %.  PHYSICAL EXAMINATION:  Physical Exam  Constitutional: She is oriented to person, place, and time. No distress.  HENT:  Head: Normocephalic and atraumatic.  Eyes: Conjunctivae are normal. Pupils are equal, round, and reactive to light.  Neck: Normal range of motion. Neck supple. No JVD present. No thyromegaly present.  Cardiovascular: Normal rate and regular rhythm.   Murmur heard. Pulmonary/Chest: No stridor. No respiratory distress. She has no wheezes. She has no rales. She exhibits no tenderness.  Abdominal: She exhibits distension. There is tenderness.  Ascites present. Hypoactive bowel sounds. Biliary drain from RUQ present  Musculoskeletal: She exhibits edema.  Neurological: She is alert and oriented to person, place, and time. No cranial nerve deficit. She exhibits normal muscle tone. GCS score is 15.  Skin: No rash noted. She is not diaphoretic. No erythema.  Psychiatric: Mood, memory and affect normal.     LABORATORY PANEL:   CBC  Recent Labs Lab 10/01/14 0405  WBC 4.0  HGB 11.0*  HCT 33.4*  PLT 127*   ------------------------------------------------------------------------------------------------------------------  Chemistries   Recent Labs Lab 10/02/14 0410  NA 131*  K 3.8  CL 103  CO2 21*  GLUCOSE 113*  BUN 7  CREATININE 0.57  CALCIUM 8.3*  AST 44*  ALT 38  ALKPHOS 426*  BILITOT 7.8*   ------------------------------------------------------------------------------------------------------------------  Cardiac Enzymes  Recent Labs Lab 09/28/14 1803  TROPONINI <0.03   ------------------------------------------------------------------------------------------------------------------  RADIOLOGY:  US Paracentesis  09/30/2014   CLINICAL DATA:  69 year old female  with a history of pancreatic carcinoma, now with  biliary obstruction secondary to pancreatic head mass.  A percutaneous transhepatic cholangiogram with drainage is indicated given her hyperbilirubinemia, and the ascites increases risk factors for percutaneous access.  She has been referred for image guided therapeutic, diagnostic, and preprocedure drainage.  EXAM: ULTRASOUND GUIDED  PARACENTESIS  COMPARISON:  None.  PROCEDURE: An ultrasound guided paracentesis was thoroughly discussed with the patient and questions answered. The benefits, risks, alternatives and complications were also discussed. The patient understands and wishes to proceed with the procedure. Written consent was obtained.  Ultrasound was performed to localize and mark an adequate pocket of fluid in the left lower quadrant of the abdomen. The area was then prepped and draped in the normal sterile fashion. 1% Lidocaine was used for local anesthesia. Under ultrasound guidance a Safe-T-Centesis catheter was introduced. Paracentesis was performed.  After removal of fluid, the Safe-T-Centesis catheter was maintained, for intermittent drainage over night. This will be to evacuate as much fluid as possible from the abdomen given her pending percutaneous transhepatic cholangiogram and drainage.  The patient tolerated the procedure well and remained hemodynamically stable throughout.  No complications were encountered and no significant blood loss was encountered.  COMPLICATIONS: None.  FINDINGS: A total of approximately 2.4 L of amber ascitic fluid was removed. A fluid sample was sent for laboratory analysis.  IMPRESSION: Status post placement of Safe-T-Centesis catheter into the left lower quadrant for drainage of the abdomen. Fluid was sent to the lab for complete analysis.  The catheter will remain overnight pending the patient's upcoming percutaneous transhepatic cholangiogram.  Signed,  Dulcy Fanny. Earleen Newport DO  [facility]  [facility]  PLAN: The drainage  catheter will remain overnight.  Intermittent drainage to evacuate as much fluid as possible from the peritoneal space.  The catheter may be removed after the percutaneous transhepatic cholangiogram on the following day.   Electronically Signed   By: Corrie Mckusick D.O.   On: 09/30/2014 13:47   Ir Percutaneous Transhepatic Cholangiogram  10/01/2014   Inez Catalina, MD     10/01/2014 11:13 AM Cholangiogram with 40F drain   Complications:  None  Blood Loss: none  See dictation in canopy pacs   Ir Biliary Drain Placement With Cholangiogram  10/01/2014   CLINICAL DATA:  Known pancreatic carcinoma with common bile duct obstruction  EXAM: IR BILIARY DRAIN PLACEMENT W/ CHOLANGIOGRAM  MEDICATIONS: 2 mg IV Versed , 100 mcg IV Fentanyl, 20 mL 1% lidocaine. The sedation time was 1 hour  CONTRAST:  17mL OMNIPAQUE IOHEXOL 300 MG/ML  SOLN  FLUOROSCOPY TIME:  Radiation Exposure Index (as provided by the fluoroscopic device): Not available  If the device does not provide the exposure index:  Fluoroscopy Time (in minutes and seconds):  14 minutes 30 seconds  Number of Acquired Images:  5  PROCEDURE: The patient was advised of the possible risks and complications and agreed to undergo the procedure. Following an appropriate time-out the patient was prepped and draped in the standard sterile manner along the right mid axillary line. 1% lidocaine was utilized for local anesthesia.  Utilizing fluoroscopic guidance, a 20 gauge diamond tip needle was placed into the right lobe of the liver. The approximately 6 passes were made prior to gaining access to a right hepatic biliary duct. Contrast was injected and within a wire was placed and coursed ports the central common hepatic duct. The tract was then dilated with a 6 [place] Accustick dilator however upon  removing the central portion of the dilator it was found that the wire had passed through the biliary duct into the portal vein. The dilator was then withdrawn to the margin of the  liver and Gel-Foam slurry was utilized to include the initial puncture tract.  Repeat puncture into the right lobe of the liver was then performed twice with subsequent access into a right hepatic biliary duct. The guidewire was passed into the central ductal system and over this the Accustick dilator was placed. Small amount of contrast was then injected to confirm positioning within the common hepatic duct. The patient was experiencing some nausea and for this reason aggressive attempts to cross the obstructing lesion were not performed. A J wire was then coiled within the common bile duct and an 8 [place] multipurpose drain was then placed within the common bile duct. The ductal system was then decompressed and a small amount of contrast was injected to confirm catheter position. The catheter was then affixed to the skin surface with 0-Prolene and placed to an external drainage bag. The patient tolerated the procedure well and was returned to her room in satisfactory condition. The previously placed Safe-T-Centesis catheter was removed without difficulty.  Complications: Inadvertent passage of the guidewire into the portal vein which was remedied with embolization of the tract with Gel-Foam slurry.  FINDINGS: There is mild intrahepatic biliary ductal dilatation with marked dilatation of the common hepatic duct and common bile duct. Distal obstructive lesion is noted consistent with the patient's given clinical history of pancreatic carcinoma. Due to some nausea and discomfort aggressive attempts to cross this obstruction were not performed. This will likely be attempted in 48 hours.  IMPRESSION: Successful placement of an 26 [place] biliary drain within the common bile duct with a external drainage.  The patient will return in the short-term for a more aggressive attempt at crossing the obstructing lesion with definitive stent placement.   Electronically Signed   By: Inez Catalina M.D.   On: 10/01/2014 11:47     EKG:   Orders placed or performed during the hospital encounter of 09/28/14  . EKG  . EKG    ASSESSMENT AND PLAN:   69 year old female with a past medical history significant for recent diagnosis of pancreatic cancer currently on chemotherapy, history of gastric bypass surgery, colonic polyps in the past, ovarian cancer status post surgical treatment, COPD and gastroesophageal reflux disease and ongoing ascites presents to the hospital secondary to weakness and a syncopal episode. She was noted to have worsening of her obstructive jaundice.  #1 obstructive jaundice-rapid worsening within 2 weeks.  - In light of her pancreatic cancer. GI has been consulted. ERCP attempted, however was unsuccessful due to her bypass surgery history.  - Ascites drained by IR and transhepatic percutaneous biliary drain placed on 10/01/2014. Appreciate interventional radiology consult  - After decompression through The biliary drain, patient will have a stent placement by IR tomorrow. -monitor her bilirubin after the stent placement. If improving likely discharge over the weekend.  #2 pancreatic cancer-follows with Dr. Grayland Ormond.  Likely with worsening jaundice it could be worsening of her tumor.  -She did finish 3-4 cycles of chemotherapy and wanted to stop because of her side effects.  -patient now want to reconsider chemotherapy if she feels stronger after this hospitalization. - Dr. Grayland Ormond has been consulted and he is aware.  #3 COPD-not in any acute exacerbation.Continue Spiriva and added Symbicort. No indication for systemic steroids.restarted her Ventolin  #4 chronic pain-continue her  pain medications home dose. On fentanyl patch and oxycodone. Pain reasonably well controlled today.  #5 DVT prophylaxis-Ted's and SCDs.  Palliative care consulted. Patient is a DO NOT RESUSCITATE. Considering hospice services for home  All the records are reviewed and case discussed with Care Management/Social  Workerr. Management plans discussed with the patient, family and they are in agreement.  CODE STATUS: DO NOT RESUSCITATE  TOTAL TIME TAKING CARE OF THIS PATIENT: 40 minutes.   POSSIBLE D/C IN 2 DAYS, DEPENDING ON CLINICAL CONDITION.   Gladstone Lighter M.D on 10/02/2014 at 9:59 AM  Between 7am to 6pm - Pager - [phone]  After 6pm go to [email or web address] - password [email or web address] [facility]  Office  [phone]  CC: Primary care physician; LADA, Satira Anis, MD

## 2014-10-03 ENCOUNTER — Inpatient Hospital Stay: Payer: Medicare Other

## 2014-10-03 LAB — COMPREHENSIVE METABOLIC PANEL
ALT: 33 U/L (ref 14–54)
ANION GAP: 6 (ref 5–15)
AST: 30 U/L (ref 15–41)
Albumin: 1.9 g/dL — ABNORMAL LOW (ref 3.5–5.0)
Alkaline Phosphatase: 358 U/L — ABNORMAL HIGH (ref 38–126)
BUN: 9 mg/dL (ref 6–20)
CALCIUM: 8 mg/dL — AB (ref 8.9–10.3)
CO2: 21 mmol/L — AB (ref 22–32)
Chloride: 105 mmol/L (ref 101–111)
Creatinine, Ser: 0.54 mg/dL (ref 0.44–1.00)
GFR calc Af Amer: >60 mL/min (ref >60)
Glucose, Bld: 119 mg/dL — ABNORMAL HIGH (ref 65–99)
Potassium: 3.4 mmol/L — ABNORMAL LOW (ref 3.5–5.1)
Sodium: 132 mmol/L — ABNORMAL LOW (ref 135–145)
Total Bilirubin: 5.8 mg/dL — ABNORMAL HIGH (ref 0.3–1.2)
Total Protein: 5 g/dL — ABNORMAL LOW (ref 6.5–8.1)

## 2014-10-03 MED ORDER — MORPHINE SULFATE 2 MG/ML IJ SOLN
1.0000 mg | Freq: Once | INTRAMUSCULAR | Status: AC
  Administered 2014-10-03: 1 mg via INTRAVENOUS
  Filled 2014-10-03: qty 1

## 2014-10-03 MED ORDER — FENTANYL CITRATE (PF) 100 MCG/2ML IJ SOLN
INTRAMUSCULAR | Status: AC
  Filled 2014-10-03: qty 4

## 2014-10-03 MED ORDER — MIDAZOLAM HCL 5 MG/5ML IJ SOLN
INTRAMUSCULAR | Status: AC | PRN
  Administered 2014-10-03: 0.5 mg via INTRAVENOUS
  Administered 2014-10-03: 2 mg via INTRAVENOUS

## 2014-10-03 MED ORDER — IOHEXOL 300 MG/ML  SOLN: 25.0000 mL | Freq: Once | INTRAMUSCULAR | Status: AC | PRN

## 2014-10-03 MED ORDER — FENTANYL CITRATE (PF) 100 MCG/2ML IJ SOLN
INTRAMUSCULAR | Status: AC | PRN
  Administered 2014-10-03: 13:00:00 50 ug via INTRAVENOUS
  Administered 2014-10-03: 25 ug via INTRAVENOUS

## 2014-10-03 MED ORDER — LIDOCAINE HCL (PF) 1 % IJ SOLN
INTRAMUSCULAR | Status: AC
  Filled 2014-10-03: qty 10

## 2014-10-03 MED ORDER — OXYCODONE HCL 5 MG PO TABS
20.0000 mg | ORAL_TABLET | Freq: Once | ORAL | Status: AC
  Administered 2014-10-03: 20 mg via ORAL
  Filled 2014-10-03: qty 4

## 2014-10-03 MED ORDER — MIDAZOLAM HCL 5 MG/5ML IJ SOLN
INTRAMUSCULAR | Status: AC
  Filled 2014-10-03: qty 5

## 2014-10-03 MED ORDER — SODIUM CHLORIDE 0.9 % IV SOLN
INTRAVENOUS | Status: DC
  Administered 2014-10-03: 12:00:00 via INTRAVENOUS

## 2014-10-03 MED ORDER — LIDOCAINE HCL (PF) 1 % IJ SOLN
INTRAMUSCULAR | Status: AC | PRN
  Administered 2014-10-03: 13:00:00 10 mL

## 2014-10-03 MED ORDER — POTASSIUM CHLORIDE 10 MEQ/100ML IV SOLN
10.0000 meq | INTRAVENOUS | Status: AC
  Administered 2014-10-03 (×4): 10 meq via INTRAVENOUS
  Filled 2014-10-03 (×4): qty 100

## 2014-10-03 MED ORDER — CIPROFLOXACIN IN D5W 400 MG/200ML IV SOLN
400.0000 mg | Freq: Two times a day (BID) | INTRAVENOUS | Status: DC
  Administered 2014-10-03 (×2): 400 mg via INTRAVENOUS
  Filled 2014-10-03 (×5): qty 200

## 2014-10-03 NOTE — Plan of Care (Signed)
Problem: Discharge Progression Outcomes Goal: Discharge plan in place and appropriate Outcome: Progressing Individualization of care: - Likes to be called Tiffany Gamble - Has history of pancreatic ca, receiving chemo, GRED and COPD, controlled by medications.    Goal: Other Discharge Outcomes/Goals Outcome: Progressing Plan of care progress to goal for: Pain-pt c/o pain, prn meds given with improvement Hemodynamically-VSS Complications-no c/o this shift Diet-pt tolerating diet this shift, pt NPO after midnight for procedure Activity-pt up to bathroom with assistance of boyfriend

## 2014-10-03 NOTE — Progress Notes (Signed)
Spoke with Dr. Posey Pronto about pt's complaint of pain and her being NPO.  MD gave telephone order for IV morphine.  Clarise Cruz, RN

## 2014-10-03 NOTE — Progress Notes (Signed)
Juanita called nurse report. Transferred pt gcs 15 resting calmly biliary drain drsg cdi small amount yellow drainage noted in drainage tubing

## 2014-10-03 NOTE — Progress Notes (Signed)
Spoke with Dr. Reece Levy about pt being NPO after midnight for stent placement 5/27.  MD stated ok to give patient 1 dose of prn pain med with sip of water.

## 2014-10-03 NOTE — Care Management (Signed)
Spoke with Dr. Tressia Miners. Possible discharge over the weekend. NPO. ERCP? Possibly today. Shelbie Ammons RN MSN Care Management [phone]

## 2014-10-03 NOTE — Plan of Care (Signed)
Problem: Discharge Progression Outcomes Goal: Other Discharge Outcomes/Goals Outcome: Progressing Pt went for revision stent placement today. tol well. Reported they were able to get stent through obstruction and draining well thru tube bile color drainage.prn x2 for  C/o pain rt  Side with relife.

## 2014-10-03 NOTE — Procedures (Signed)
Successful internal/external 10 fr biliary drain insertion No comp Stable Full report in PACS

## 2014-10-03 NOTE — Progress Notes (Signed)
Initial Nutrition Assessment  DOCUMENTATION CODES:     INTERVENTION: Meals and Snacks: Cater to patient preferences once diet order able to be advanced Medical Food Supplement Therapy: will recommend on follow if intake poor    NUTRITION DIAGNOSIS:  Inadequate oral intake related to inability to eat as evidenced by NPO status.  GOAL:  Patient will meet greater than or equal to 90% of their needs once diet order able to be advanced Goal for diet progression and tolerance s/p procedure today  MONITOR:   (Energy Intake, Hepatic Profile, Glucose Profile, Anthropometrics, Digestive system)  REASON FOR ASSESSMENT:   (RD Screen, Length of Stay)    ASSESSMENT:  Pt admitted with hyperbilirubinemia with worsening jaundice. Pt scheduled for biliary stent placement today. Pt out of room on visit. PMHx: Past Medical History  Diagnosis Date  . Lupus   . Staph infection     MRSA  . Arthritis   . Cancer 2016    pancreas  . Ovarian cancer 2011    left    PO Intake: Pt NPO today. Per I and O chart, pt eating 80-100% of meals recorded. Per MST no decrease in po intake PTA.  Medications: Cipro, Protonix Labs: Electrolyte and Renal Profile:    Recent Labs Lab 10/01/14 0405 10/02/14 0410 10/03/14 0515  BUN 6 7 9   CREATININE 0.31* 0.57 0.54  NA 134* 131* 132*  K 3.3* 3.8 3.4*   Glucose Profile: No results for input(s): GLUCAP in the last 72 hours.  Protein Profile:  Recent Labs Lab 10/01/14 0405 10/02/14 0410 10/03/14 0515  ALBUMIN 2.1* 2.0* 1.9*   Hepatic Function Latest Ref Rng 10/03/2014 10/02/2014 10/01/2014  Total Protein 6.5 - 8.1 g/dL 5.0(L) 5.1(L) 5.3(L)  Albumin 3.5 - 5.0 g/dL 1.9(L) 2.0(L) 2.1(L)  AST 15 - 41 U/L 30 44(H) 62(H)  ALT 14 - 54 U/L 33 38 40  Alk Phosphatase 38 - 126 U/L 358(H) 426(H) 455(H)  Total Bilirubin 0.3 - 1.2 mg/dL 5.8(H) 7.8(H) 11.6(H)    Per MST no change in weight PTA. Per weight in CHL, pt positive for weight  gain.  Height:  Ht Readings from Last 1 Encounters:  09/28/14 5' 1"  (1.549 m)    Weight:  Wt Readings from Last 1 Encounters:  09/30/14 201 lb 8 oz (91.4 kg)    Ideal Body Weight:   47.7kg  Wt Readings from Last 10 Encounters:  09/30/14 201 lb 8 oz (91.4 kg)  09/17/14 181 lb 10.5 oz (82.4 kg)  09/10/14 192 lb 14.4 oz (87.5 kg)  07/30/14 190 lb 0.6 oz (86.2 kg)  07/17/14 191 lb (86.637 kg)    Unable to complete Nutrition-Focused physical exam at this time.    BMI:  Body mass index is 38.09 kg/(m^2).  Skin:  Reviewed, no issues  Diet Order:  Diet NPO time specified  EDUCATION NEEDS:  No education needs identified at this time   Intake/Output Summary (Last 24 hours) at 10/03/14 1503 Last data filed at 10/03/14 1338  Gross per 24 hour  Intake    240 ml  Output    580 ml  Net   -340 ml    Last BM:  5/26 loose BM  LOW Care Level  Dwyane Luo, RD, LDN Pager [phone]

## 2014-10-03 NOTE — Discharge Instructions (Signed)
Biliary Drainage Catheter Placement, Care After Refer to this sheet in the next few weeks. These instructions provide you with information on caring for yourself after your procedure. Your health care provider may also give you more specific instructions. Your treatment has been planned according to current medical practices, but problems sometimes occur. Call your health care provider if you have any problems or questions after your procedure. WHAT TO EXPECT AFTER THE PROCEDURE After your procedure, it is typical to have the following:  Pain or soreness at the catheter insertion site.  Drowsiness for several hours after the procedure.  Some bruising at the catheter insertion site.  Drainage into the collection bag on the outside of your body, if you have an external drainage catheter. You might see bloody discharge in the bag for the first day or two. This should turn a yellow-green color soon afterward. HOME CARE INSTRUCTIONS   Do not use machinery, drive, or make legal decisions for 24 hours after your procedure.  Have someone drive you home.  Resume your usual diet. Avoid alcoholic beverages for 24 hours after your procedure.  Rest for the remainder of the day.  Only take over-the-counter or prescription medicines for pain, discomfort, or fever as directed by your health care provider. Do not take aspirin or blood thinners unless directed otherwise. This can make bleeding worse.  Clean the tube insertion site as directed by your health care provider.  Take showers, not baths. Avoid pools and hot tubs. Before showering, cover the area with plastic wrap and tape the edges of the plastic wrap to your skin. This is done to keep your skin dry.  Keep the skin around the insertion site dry. If the area gets wet, dry the skin completely.  Keep all follow-up appointments. SEEK MEDICAL CARE IF:   Your pain gets worse and is not relieved with pain medicines after an initial  improvement.  You have any questions about your tube.  Your skin breaks down around the tube.  You have a fever.  You have chills. SEEK IMMEDIATE MEDICAL CARE IF:   Your redness, soreness, or swelling at the tube insertion site gets worse despite good cleaning.  You have leakage of bile around the tube.  Your tube becomes blocked or clogged.  Your catheter is dislodged or comes out. Document Released: 12/08/2003 Document Revised: 04/30/2013 Document Reviewed: 12/31/2012 [facility] Patient Information 2015 [place], [place]. This information is not intended to replace advice given to you by your health care provider. Make sure you discuss any questions you have with your health care provider.

## 2014-10-03 NOTE — Progress Notes (Signed)
[facility] [place] NAME: Tiffany Gamble    MR#:  [id number]  DATE OF BIRTH:  January 01, 1946  SUBJECTIVE:  CHIEF COMPLAINT:   Chief Complaint  Patient presents with  . Stroke Symptoms    IR guided biliary stent placement today. Has biliary drain in place. Bilirubin is improving. Feels better. Anticipating discharge tomorrow.  REVIEW OF SYSTEMS:  ROS   Constitutional: No malaise or fatigue. Negative for fever and chills.  Eyes:   Icteric sclerae. Improving jaundice  Respiratory: Negative for cough, shortness of breath and wheezing.  Cardiovascular: Negative for chest pain and palpitations.  Gastrointestinal: Positive for nausea and abdominal pain- which are chronic. Negative for vomiting, diarrhea and constipation.   Abdominal distention, ascites.  Genitourinary: Negative for dysuria.  Musculoskeletal: Positive for back pain.   Chronic pain  Skin:   Jaundiced  Neurological: Positive for weakness. Negative for dizziness, seizures and headaches.   DRUG ALLERGIES:   Allergies  Allergen Reactions  . Acetaminophen Other (See Comments)    Other reaction(s): Other (qualifier value) Liver problems Affects liver.  . Clotrimazole     Other reaction(s): Unknown Pt. does not know  . Codeine Hives    Other reaction(s): Weal  . Fluticasone Furoate-Vilanterol Other (See Comments)    thrush  . Hydrocodone-Acetaminophen Itching    Other reaction(s): Itching of Skin, Weal itching  . Ibuprofen Other (See Comments)    alters liver function Other reaction(s): Other (qualifier value) liver problems Affects liver.  . Prednisone Other (See Comments) and Swelling    Throat swelling  . Penicillin G Rash  . Penicillins Rash    VITALS:  Blood pressure 97/68, pulse 86, temperature 98.6 F (37 C), temperature source Oral, resp. rate 18, height 5\' 1"  (1.549 m), weight 91.4 kg (201 lb 8 oz), SpO2 96 %.  PHYSICAL  EXAMINATION:  Physical Exam  Constitutional: She is oriented to person, place, and time. No distress.  HENT:  Head: Normocephalic and atraumatic.  Eyes: Conjunctivae are normal. Pupils are equal, round, and reactive to light.  Neck: Normal range of motion. Neck supple. No JVD present. No thyromegaly present.  Cardiovascular: Normal rate and regular rhythm.   Murmur heard. Pulmonary/Chest: No stridor. No respiratory distress. She has no wheezes. She has no rales. She exhibits no tenderness.  Abdominal: She exhibits distension. There is tenderness.  Ascites present. Hypoactive bowel sounds. Biliary drain from RUQ present  Musculoskeletal: She exhibits edema.  Neurological: She is alert and oriented to person, place, and time. No cranial nerve deficit. She exhibits normal muscle tone. GCS score is 15.  Skin: No rash noted. She is not diaphoretic. No erythema.  Psychiatric: Mood, memory and affect normal.     LABORATORY PANEL:   CBC  Recent Labs Lab 10/01/14 0405  WBC 4.0  HGB 11.0*  HCT 33.4*  PLT 127*   ------------------------------------------------------------------------------------------------------------------  Chemistries   Recent Labs Lab 10/03/14 0515  NA 132*  K 3.4*  CL 105  CO2 21*  GLUCOSE 119*  BUN 9  CREATININE 0.54  CALCIUM 8.0*  AST 30  ALT 33  ALKPHOS 358*  BILITOT 5.8*   ------------------------------------------------------------------------------------------------------------------  Cardiac Enzymes  Recent Labs Lab 09/28/14 1803  TROPONINI <0.03   ------------------------------------------------------------------------------------------------------------------  RADIOLOGY:  Ir Percutaneous Transhepatic Cholangiogram  10/01/2014   Inez Catalina, MD     10/01/2014 11:13 AM Cholangiogram with 38F drain   Complications:  None  Blood Loss: none  See dictation in canopy  pacs    EKG:   Orders placed or performed during the hospital encounter  of 09/28/14  . EKG  . EKG    ASSESSMENT AND PLAN:   69 year old female with a past medical history significant for recent diagnosis of pancreatic cancer currently on chemotherapy, history of gastric bypass surgery, colonic polyps in the past, ovarian cancer status post surgical treatment, COPD and gastroesophageal reflux disease and ongoing ascites presents to the hospital secondary to weakness and a syncopal episode. She was noted to have worsening of her obstructive jaundice.  #1 obstructive jaundice-rapid worsening within 2 weeks.  - In light of her pancreatic cancer. GI has been consulted. ERCP attempted, however was unsuccessful due to her bypass surgery history.  - Ascites drained by IR and transhepatic percutaneous biliary drain placed on 10/01/2014. Appreciate interventional radiology consult  - For stent placement by IR this afternoon. -monitor her bilirubin after the stent placement. If improving likely discharge over the weekend.  #2 pancreatic cancer-follows with Dr. Grayland Ormond.  Likely with worsening jaundice it could be worsening of her tumor with obstruction.  -She did finish 3-4 cycles of chemotherapy and wanted to stop because of her side effects.  -patient now want to reconsider chemotherapy if she feels stronger after this hospitalization. - Dr. Grayland Ormond has been consulted and he is aware.  #3 COPD-not in any acute exacerbation.Continue Spiriva and added Symbicort. No indication for systemic steroids.restarted her Ventolin  #4 chronic pain-continue her pain medications home dose. On fentanyl patch and oxycodone. Pain reasonably well controlled today.  #5 DVT prophylaxis-Ted's and SCDs.  Palliative care consulted. Patient is a DO NOT RESUSCITATE. Considering hospice services for home  All the records are reviewed and case discussed with Care Management/Social Workerr. Management plans discussed with the patient, family and they are in agreement.  CODE STATUS: DO NOT  RESUSCITATE  TOTAL TIME TAKING CARE OF THIS PATIENT: 40 minutes.   POSSIBLE D/C TOMORROW, DEPENDING ON CLINICAL CONDITION.   Gladstone Lighter M.D on 10/03/2014 at 10:47 AM  Between 7am to 6pm - Pager - [phone]  After 6pm go to [email or web address] - password [email or web address] [facility]  Office  [phone]  CC: Primary care physician; LADA, Satira Anis, MD

## 2014-10-04 LAB — COMPREHENSIVE METABOLIC PANEL
ALBUMIN: 1.9 g/dL — AB (ref 3.5–5.0)
ALT: 30 U/L (ref 14–54)
AST: 29 U/L (ref 15–41)
Alkaline Phosphatase: 305 U/L — ABNORMAL HIGH (ref 38–126)
Anion gap: 6 (ref 5–15)
BILIRUBIN TOTAL: 5.2 mg/dL — AB (ref 0.3–1.2)
BUN: 9 mg/dL (ref 6–20)
CALCIUM: 8.1 mg/dL — AB (ref 8.9–10.3)
CHLORIDE: 105 mmol/L (ref 101–111)
CO2: 21 mmol/L — ABNORMAL LOW (ref 22–32)
Creatinine, Ser: 0.65 mg/dL (ref 0.44–1.00)
GFR calc Af Amer: >60 mL/min (ref >60)
GFR calc non Af Amer: >60 mL/min (ref >60)
Glucose, Bld: 129 mg/dL — ABNORMAL HIGH (ref 65–99)
Potassium: 3.7 mmol/L (ref 3.5–5.1)
SODIUM: 132 mmol/L — AB (ref 135–145)
Total Protein: 4.9 g/dL — ABNORMAL LOW (ref 6.5–8.1)

## 2014-10-04 MED ORDER — BUDESONIDE-FORMOTEROL FUMARATE 160-4.5 MCG/ACT IN AERO: 2.0000 | INHALATION_SPRAY | Freq: Two times a day (BID) | RESPIRATORY_TRACT | Status: AC

## 2014-10-04 NOTE — Plan of Care (Signed)
Problem: Discharge Progression Outcomes Goal: Other Discharge Outcomes/Goals Plan of care progress to goal:  Pain: med given x1 with relief Hemodynamically: IV potassium given, waiting on lab results Diet: tolerating

## 2014-10-04 NOTE — Progress Notes (Addendum)
Pt discharged home per MD order. DNR form given to patient. Prescription given. Discharge instructions reviewed with the patient and husband. Verbalized understanding. OK to reschedule and place dose of fentanyl patch, per Dr. Tressia Miners before discharge. All belongings packed up by family and patient. Pt left via wheelchair with nursing and family.

## 2014-10-04 NOTE — Progress Notes (Signed)
[facility] [place] NAME: Tiffany Gamble    MR#:  [id number]  DATE OF BIRTH:  May 05, 1946  SUBJECTIVE:  CHIEF COMPLAINT:   Chief Complaint  Patient presents with  . Stroke Symptoms    Biliary stent cannot be placed yesterday as the right stent was not available. Pt feels better. Stent placement rescheduled for outpatient next Thursday. Patient ambulating in hallways with boyfriends' assistance. Bilirubin stable.  REVIEW OF SYSTEMS:  ROS   Constitutional: No malaise or fatigue. Negative for fever and chills.  Eyes:   Icteric sclerae. Improving jaundice  Respiratory: Negative for cough, shortness of breath and wheezing.  Cardiovascular: Negative for chest pain and palpitations.  Gastrointestinal: Positive for nausea and abdominal pain- which are chronic. Negative for vomiting, diarrhea and constipation.   Abdominal distention, ascites.  Genitourinary: Negative for dysuria.  Musculoskeletal: Positive for back pain- chronic.   Chronic pain  Skin:   Jaundiced  Neurological: Negative for dizziness, seizures and headaches.   DRUG ALLERGIES:   Allergies  Allergen Reactions  . Acetaminophen Other (See Comments)    Other reaction(s): Other (qualifier value) Liver problems Affects liver.  . Clotrimazole     Other reaction(s): Unknown Pt. does not know  . Codeine Hives    Other reaction(s): Weal  . Fluticasone Furoate-Vilanterol Other (See Comments)    thrush  . Hydrocodone-Acetaminophen Itching    Other reaction(s): Itching of Skin, Weal itching  . Ibuprofen Other (See Comments)    alters liver function Other reaction(s): Other (qualifier value) liver problems Affects liver.  . Prednisone Other (See Comments) and Swelling    Throat swelling  . Penicillin G Rash  . Penicillins Rash    VITALS:  Blood pressure 99/70, pulse 85, temperature 98.4 F (36.9 C), temperature source Oral, resp. rate 18,  height 5\' 1"  (1.549 m), weight 91.4 kg (201 lb 8 oz), SpO2 96 %.  PHYSICAL EXAMINATION:  Physical Exam  Constitutional: She is oriented to person, place, and time. No distress.  HENT:  Head: Normocephalic and atraumatic.  Eyes: Conjunctivae are normal. Pupils are equal, round, and reactive to light.  Neck: Normal range of motion. Neck supple. No JVD present. No thyromegaly present.  Cardiovascular: Normal rate and regular rhythm.   Murmur heard. Pulmonary/Chest: No stridor. No respiratory distress. She has no wheezes. She has no rales. She exhibits no tenderness.  Abdominal: She exhibits distension. There is tenderness.  Ascites present. Hypoactive bowel sounds. Biliary drain from RUQ present  Musculoskeletal: She exhibits edema.  Neurological: She is alert and oriented to person, place, and time. No cranial nerve deficit. She exhibits normal muscle tone. GCS score is 15.  Skin: No rash noted. She is not diaphoretic. No erythema.  Psychiatric: Mood, memory and affect normal.     LABORATORY PANEL:   CBC  Recent Labs Lab 10/01/14 0405  WBC 4.0  HGB 11.0*  HCT 33.4*  PLT 127*   ------------------------------------------------------------------------------------------------------------------  Chemistries   Recent Labs Lab 10/04/14 0444  NA 132*  K 3.7  CL 105  CO2 21*  GLUCOSE 129*  BUN 9  CREATININE 0.65  CALCIUM 8.1*  AST 29  ALT 30  ALKPHOS 305*  BILITOT 5.2*   ------------------------------------------------------------------------------------------------------------------  Cardiac Enzymes  Recent Labs Lab 09/28/14 1803  TROPONINI <0.03   ------------------------------------------------------------------------------------------------------------------  RADIOLOGY:  Ir Biliary Dilitation  10/03/2014   CLINICAL DATA:  Pancreatic adenocarcinoma with distal CBD obstruction and jaundice, status post right external biliary drain only  EXAM: FLUOROSCOPIC  CONVERSION OF THE EXISTING 8 FR BILIARY DRAIN TO AN INTERNAL EXTERNAL 10 FRENCH BILIARY DRAIN (across the distal CBD obstruction)  Date:  5/27/20165/27/2016 1:28 pm  Radiologist:  M. Daryll Brod, MD  Guidance:  A fluoroscopic  FLUOROSCOPY TIME:  2.8 minutes  MEDICATIONS AND MEDICAL HISTORY: 400 mg Cipro administered within 1 hour of the procedure, 75 mcg fentanyl, 2.5 mg Versed  ANESTHESIA/SEDATION: 20 minutes  CONTRAST:  10 cc Omnipaque 938  COMPLICATIONS: None immediate  PROCEDURE: Informed consent was obtained from the patient following explanation of the procedure, risks, benefits and alternatives. The patient understands, agrees and consents for the procedure. All questions were addressed. A time out was performed.  Maximal barrier sterile technique utilized including caps, mask, sterile gowns, sterile gloves, large sterile drape, hand hygiene, and ChloraPrep.  Under sterile conditions and local anesthesia, the existing 8 French right external only biliary drain was injected with contrast. This confirms position in the common hepatic duct inferior to the biliary confluence. Persistent distal CBD obstruction again evident. This catheter was cut and removed over a Bentson guidewire. Seven French sheath inserted. Sheath cholangiogram again confirms distal CBD obstruction. Five [place] Kumpe the catheter and a Glidewire were manipulated through the distal CBD obstruction into the duodenum. Contrast injection confirms position in the duodenum. Amplatz guidewire exchange performed. Over the guidewire, a new 66 [place] internal external biliary drain was advanced with the retention loop formed in the duodenum. Contrast injection confirms access across the distal CBD obstruction and adequate biliary drainage. Biliary system decompressed by syringe aspiration. Images obtained for documentation. Catheter secured with a Prolene suture and connected to external gravity drainage bag.  IMPRESSION: Successful conversion of the  external only biliary drain to a 10 [place] internal external biliary drain across the distal CBD obstruction.  PLAN: Patient will return as an outpatient next Thursday 10/09/2014 for insertion of a Wallflex stent(covered endoscopically retrievable stent) and removal of the drain catheter.   Electronically Signed   By: Jerilynn Mages.  Shick M.D.   On: 10/03/2014 14:14    EKG:   Orders placed or performed during the hospital encounter of 09/28/14  . EKG  . EKG    ASSESSMENT AND PLAN:   69 year old female with a past medical history significant for recent diagnosis of pancreatic cancer currently on chemotherapy, history of gastric bypass surgery, colonic polyps in the past, ovarian cancer status post surgical treatment, COPD and gastroesophageal reflux disease and ongoing ascites presents to the hospital secondary to weakness and a syncopal episode. She was noted to have worsening of her obstructive jaundice.  #1 obstructive jaundice-rapid worsening within 2 weeks.  - In light of her pancreatic cancer. GI has been consulted. ERCP attempted, however was unsuccessful due to her bypass surgery history.  - Ascites drained by IR and transhepatic percutaneous biliary drain placed on 10/01/2014. Appreciate interventional radiology consult  - Stent placement by IR as outpatient next week. Cont biliary drain- qdaily dressing changes needed. No need to flush.. -Bili stable.  #2 pancreatic cancer-follows with Dr. Grayland Ormond.  Likely with worsening jaundice it could be worsening of her tumor with obstruction.  -She did finish 3-4 cycles of chemotherapy and wanted to stop because of her side effects.  -patient now want to reconsider chemotherapy if she feels stronger after this hospitalization. - Dr. Grayland Ormond has been consulted and he is aware.  #3 COPD-not in any acute exacerbation.Continue Spiriva and added Symbicort. No indication for systemic steroids.restarted her Ventolin  #4 chronic pain-continue  her pain  medications home dose. On fentanyl patch and oxycodone. Pain reasonably well controlled. Pain meds will be dispensed by Dr. Grayland Ormond only.  #5 DVT prophylaxis-Ted's and SCDs.  Palliative care consulted. Patient is a DO NOT RESUSCITATE. Considering hospice services for home  All the records are reviewed and case discussed with Care Management/Social Workerr. Management plans discussed with the patient, family and they are in agreement.  CODE STATUS: DO NOT RESUSCITATE  TOTAL TIME TAKING CARE OF THIS PATIENT: 40 minutes.   POSSIBLE D/C TODAY, DEPENDING ON CLINICAL CONDITION.   Gladstone Lighter M.D on 10/04/2014 at 9:39 AM  Between 7am to 6pm - Pager - [phone]  After 6pm go to [email or web address] - password [email or web address] [facility]  Office  [phone]  CC: Primary care physician; LADA, Satira Anis, MD

## 2014-10-04 NOTE — Discharge Summary (Addendum)
[place] [facility] [place] NAME: Tiffany Gamble    MR#:  [id number]  DATE OF BIRTH:  1945/09/18  DATE OF ADMISSION:  09/28/2014 ADMITTING PHYSICIAN: Lytle Butte, MD  DATE OF DISCHARGE: 10/04/14  PRIMARY CARE PHYSICIAN: LADA, MELINDA P, MD    ADMISSION DIAGNOSIS:  Hypokalemia [E87.6] Hyperbilirubinemia [E80.6] Syncope, unspecified syncope type [R55]  DISCHARGE DIAGNOSIS:  Principal Problem:   Obstructive hyperbilirubinemia Active Problems:   Syncope   Ascites   Hyperbilirubinemia   Jaundice   SECONDARY DIAGNOSIS:   Past Medical History  Diagnosis Date  . Lupus   . Staph infection     MRSA  . Arthritis   . Cancer 2016    pancreas  . Ovarian cancer 2011    left    HOSPITAL COURSE:   69 year old female with a past medical history significant for recent diagnosis of pancreatic cancer currently on chemotherapy, history of gastric bypass surgery, colonic polyps in the past, ovarian cancer status post surgical treatment, COPD and gastroesophageal reflux disease and ongoing ascites presents to the hospital secondary to weakness and a syncopal episode. She was noted to have worsening of her obstructive jaundice.  #1 obstructive jaundice-rapid worsening within 2 weeks.  - In light of her pancreatic cancer. GI has been consulted. ERCP attempted, however was unsuccessful due to her bypass surgery history.  - Ascites drained by IR and transhepatic percutaneous biliary drain placed on 10/01/2014. Appreciate interventional radiology consult  - Stent placement by IR as outpatient next week. Cont biliary drain- qdaily dressing changes needed. No need to flush.. -Bili stable.  #2 pancreatic cancer-follows with Dr. Grayland Ormond. Likely with worsening jaundice it could be worsening of her tumor with obstruction.  -She did finish 3-4 cycles of chemotherapy and wanted to stop because of her side effects.  -patient now want to reconsider  chemotherapy if she feels stronger after this hospitalization. - Dr. Grayland Ormond has been consulted and he is aware.  #3 COPD-not in any acute exacerbation.Continue Spiriva and added Symbicort. No indication for systemic steroids.restarted her Ventolin  #4 chronic pain-continue her pain medications home dose. On fentanyl patch and oxycodone. Pain reasonably well controlled. Pain meds will be dispensed by Dr. Grayland Ormond only.  DISCHARGE CONDITIONS:   Guarded  CONSULTS OBTAINED:  Treatment Team:  Lytle Butte, MD Lloyd Huger, MD Manya Silvas, MD  DRUG ALLERGIES:   Allergies  Allergen Reactions  . Acetaminophen Other (See Comments)    Other reaction(s): Other (qualifier value) Liver problems Affects liver.  . Clotrimazole     Other reaction(s): Unknown Pt. does not know  . Codeine Hives    Other reaction(s): Weal  . Fluticasone Furoate-Vilanterol Other (See Comments)    thrush  . Hydrocodone-Acetaminophen Itching    Other reaction(s): Itching of Skin, Weal itching  . Ibuprofen Other (See Comments)    alters liver function Other reaction(s): Other (qualifier value) liver problems Affects liver.  . Prednisone Other (See Comments) and Swelling    Throat swelling  . Penicillin G Rash  . Penicillins Rash    DISCHARGE MEDICATIONS:   Current Discharge Medication List    START taking these medications   Details  budesonide-formoterol (SYMBICORT) 160-4.5 MCG/ACT inhaler Inhale 2 puffs into the lungs 2 (two) times daily. Qty: 1 Inhaler, Refills: 2      CONTINUE these medications which have NOT CHANGED   Details  cyclobenzaprine (FLEXERIL) 10 MG tablet Take 1 tablet (10 mg total) by mouth  2 (two) times daily. Qty: 60 tablet, Refills: 3    donepezil (ARICEPT) 5 MG tablet Take 5 mg by mouth at bedtime.  Refills: 2    fentaNYL (DURAGESIC - DOSED MCG/HR) 50 MCG/HR Place 1 patch (50 mcg total) onto the skin every 3 (three) days. Qty: 10 patch, Refills: 0     furosemide (LASIX) 40 MG tablet Take 1 tablet (40 mg total) by mouth daily. Qty: 30 tablet, Refills: 2    lidocaine-prilocaine (EMLA) cream Apply 1 application topically as needed. Apply to port then cover with saran wrap 1-2 hours prior to chemotherapy Qty: 30 g, Refills: 1    Multiple Vitamin (MULTI-VITAMINS) TABS Take by mouth daily.     Oxycodone HCl 20 MG TABS Take 1 tablet (20 mg total) by mouth 4 (four) times daily as needed. Qty: 180 tablet, Refills: 0    pantoprazole (PROTONIX) 40 MG tablet Take 40 mg by mouth 2 (two) times daily.  Refills: 5    polyethylene glycol powder (GLYCOLAX/MIRALAX) powder Take by mouth 2 (two) times daily.  Refills: 5    potassium chloride SA (K-DUR,KLOR-CON) 20 MEQ tablet Take 2 tablets (40 mEq total) by mouth daily. Qty: 30 tablet, Refills: 3    prochlorperazine (COMPAZINE) 10 MG tablet Take 10 mg by mouth every 8 (eight) hours as needed.     SPIRIVA HANDIHALER 18 MCG inhalation capsule Place 18 mcg into inhaler and inhale daily.  Refills: 3    ursodiol (ACTIGALL) 300 MG capsule Take by mouth 4 (four) times daily.     VENTOLIN HFA 108 (90 BASE) MCG/ACT inhaler Inhale 2 puffs into the lungs every 6 (six) hours as needed.  Refills: 12    fluticasone (FLONASE) 50 MCG/ACT nasal spray Place 2 sprays into both nostrils daily.  Refills: 11         DISCHARGE INSTRUCTIONS:    1. F/u with Dr. Grayland Ormond in 1 week 2. PCP f/u in 2 weeks  If you experience worsening of your admission symptoms, develop shortness of breath, life threatening emergency, suicidal or homicidal thoughts you must seek medical attention immediately by calling 911 or calling your MD immediately  if symptoms less severe.  You Must read complete instructions/literature along with all the possible adverse reactions/side effects for all the Medicines you take and that have been prescribed to you. Take any new Medicines after you have completely understood and accept all the  possible adverse reactions/side effects.   Please note  You were cared for by a hospitalist during your hospital stay. If you have any questions about your discharge medications or the care you received while you were in the hospital after you are discharged, you can call the unit and asked to speak with the hospitalist on call if the hospitalist that took care of you is not available. Once you are discharged, your primary care physician will handle any further medical issues. Please note that NO REFILLS for any discharge medications will be authorized once you are discharged, as it is imperative that you return to your primary care physician (or establish a relationship with a primary care physician if you do not have one) for your aftercare needs so that they can reassess your need for medications and monitor your lab values.   TODAY   CHIEF COMPLAINT:   Chief Complaint  Patient presents with  . Stroke Symptoms    VITAL SIGNS:  Blood pressure 99/70, pulse 85, temperature 98.4 F (36.9 C), temperature source Oral, resp. rate  18, height 5\' 1"  (1.549 m), weight 91.4 kg (201 lb 8 oz), SpO2 96 %.  I/O:   Intake/Output Summary (Last 24 hours) at 10/04/14 0945 Last data filed at 10/04/14 0415  Gross per 24 hour  Intake    240 ml  Output    455 ml  Net   -215 ml    PHYSICAL EXAMINATION:   Physical Exam  Constitutional: She is oriented to person, place, and time. No distress.  HENT:  Head: Normocephalic and atraumatic.  Eyes: Conjunctivae are normal. Pupils are equal, round, and reactive to light.  Neck: Normal range of motion. Neck supple. No JVD present. No thyromegaly present.  Cardiovascular: Normal rate and regular rhythm.  Murmur heard. Pulmonary/Chest: No stridor. No respiratory distress. She has no wheezes. She has no rales. She exhibits no tenderness.  Abdominal: She exhibits distension. There is tenderness.  Ascites present. Hypoactive bowel sounds. Biliary drain  from RUQ present  Musculoskeletal: She exhibits edema.  Neurological: She is alert and oriented to person, place, and time. No cranial nerve deficit. She exhibits normal muscle tone. GCS score is 15.  Skin: No rash noted. She is not diaphoretic. No erythema.  Psychiatric: Mood, memory and affect normal.   DATA REVIEW:   CBC  Recent Labs Lab 10/01/14 0405  WBC 4.0  HGB 11.0*  HCT 33.4*  PLT 127*    Chemistries   Recent Labs Lab 10/04/14 0444  NA 132*  K 3.7  CL 105  CO2 21*  GLUCOSE 129*  BUN 9  CREATININE 0.65  CALCIUM 8.1*  AST 29  ALT 30  ALKPHOS 305*  BILITOT 5.2*    Cardiac Enzymes  Recent Labs Lab 09/28/14 1803  TROPONINI <0.03    Microbiology Results  Results for orders placed or performed during the hospital encounter of 09/28/14  Anaerobic culture     Status: None (Preliminary result)   Collection Time: 09/30/14 12:20 PM  Result Value Ref Range Status   Specimen Description FLUID  Final   Special Requests NONE  Final   Culture NO ANAEROBES ISOLATED  Final   Report Status PENDING  Incomplete  Body fluid culture     Status: None (Preliminary result)   Collection Time: 09/30/14 12:20 PM  Result Value Ref Range Status   Specimen Description FLUID  Final   Special Requests NONE  Final   Gram Stain RARE WBC SEEN NO ORGANISMS SEEN   Final   Culture NO GROWTH 3 DAYS  Final   Report Status PENDING  Incomplete    RADIOLOGY:  Ir Biliary Dilitation  10/03/2014   CLINICAL DATA:  Pancreatic adenocarcinoma with distal CBD obstruction and jaundice, status post right external biliary drain only  EXAM: FLUOROSCOPIC CONVERSION OF THE EXISTING 8 FR BILIARY DRAIN TO AN INTERNAL EXTERNAL 10 FRENCH BILIARY DRAIN (across the distal CBD obstruction)  Date:  5/27/20165/27/2016 1:28 pm  Radiologist:  Jerilynn Mages. Daryll Brod, MD  Guidance:  A fluoroscopic  FLUOROSCOPY TIME:  2.8 minutes  MEDICATIONS AND MEDICAL HISTORY: 400 mg Cipro administered within 1 hour of the  procedure, 75 mcg fentanyl, 2.5 mg Versed  ANESTHESIA/SEDATION: 20 minutes  CONTRAST:  10 cc Omnipaque 956  COMPLICATIONS: None immediate  PROCEDURE: Informed consent was obtained from the patient following explanation of the procedure, risks, benefits and alternatives. The patient understands, agrees and consents for the procedure. All questions were addressed. A time out was performed.  Maximal barrier sterile technique utilized including caps, mask, sterile gowns, sterile gloves, large  sterile drape, hand hygiene, and ChloraPrep.  Under sterile conditions and local anesthesia, the existing 8 French right external only biliary drain was injected with contrast. This confirms position in the common hepatic duct inferior to the biliary confluence. Persistent distal CBD obstruction again evident. This catheter was cut and removed over a Bentson guidewire. Seven French sheath inserted. Sheath cholangiogram again confirms distal CBD obstruction. Five [place] Kumpe the catheter and a Glidewire were manipulated through the distal CBD obstruction into the duodenum. Contrast injection confirms position in the duodenum. Amplatz guidewire exchange performed. Over the guidewire, a new 66 [place] internal external biliary drain was advanced with the retention loop formed in the duodenum. Contrast injection confirms access across the distal CBD obstruction and adequate biliary drainage. Biliary system decompressed by syringe aspiration. Images obtained for documentation. Catheter secured with a Prolene suture and connected to external gravity drainage bag.  IMPRESSION: Successful conversion of the external only biliary drain to a 10 [place] internal external biliary drain across the distal CBD obstruction.  PLAN: Patient will return as an outpatient next Thursday 10/09/2014 for insertion of a Wallflex stent(covered endoscopically retrievable stent) and removal of the drain catheter.   Electronically Signed   By: Jerilynn Mages.  Shick M.D.    On: 10/03/2014 14:14    EKG:   Orders placed or performed during the hospital encounter of 09/28/14  . EKG  . EKG      Management plans discussed with the patient, family and they are in agreement.  CODE STATUS:     Code Status Orders        Start     Ordered   10/03/14 1627  DNR   10/03/14 1626    Advance Directive Documentation        Most Recent Value   Type of Advance Directive  Healthcare Power of Attorney, Living will   Pre-existing out of facility DNR order (yellow form or pink MOST form)     "MOST" Form in Place?        TOTAL TIME TAKING CARE OF THIS PATIENT: 40 minutes.    Gladstone Lighter M.D on 10/04/2014 at 9:45 AM  Between 7am to 6pm - Pager - [phone]  After 6pm go to [email or web address] - password [email or web address] [facility]  Office  [phone]  CC: Primary care physician; LADA, Satira Anis, MD

## 2014-10-05 ENCOUNTER — Other Ambulatory Visit: Payer: Self-pay | Admitting: Family Medicine

## 2014-10-05 LAB — BODY FLUID CULTURE: Culture: NO GROWTH

## 2014-10-05 MED ORDER — POTASSIUM CHLORIDE 20 MEQ PO PACK: 40.0000 meq | PACK | Freq: Two times a day (BID) | ORAL | Status: AC

## 2014-10-05 MED ORDER — POTASSIUM CHLORIDE 20 MEQ PO PACK: 40.0000 meq | PACK | Freq: Two times a day (BID) | ORAL | Status: DC

## 2014-10-07 ENCOUNTER — Other Ambulatory Visit: Payer: Self-pay | Admitting: Radiology

## 2014-10-07 LAB — ANAEROBIC CULTURE

## 2014-10-09 ENCOUNTER — Ambulatory Visit: Admit: 2014-10-09 | Payer: Medicare Other

## 2014-10-13 ENCOUNTER — Inpatient Hospital Stay
Admit: 2014-10-13 | Discharge: 2014-10-13 | Disposition: A | Discharge status: Home / Self Care | Payer: Medicare Other | Attending: Internal Medicine | Admitting: Internal Medicine

## 2014-10-13 ENCOUNTER — Emergency Department: Payer: Medicare Other

## 2014-10-13 ENCOUNTER — Inpatient Hospital Stay
Admission: EM | Admit: 2014-10-13 | Discharge: 2014-10-15 | DRG: 435 | Disposition: A | Discharge status: Hospice | Payer: Medicare Other | Attending: Internal Medicine | Admitting: Internal Medicine

## 2014-10-13 ENCOUNTER — Encounter: Payer: Self-pay | Admitting: Emergency Medicine

## 2014-10-13 DIAGNOSIS — E222 Syndrome of inappropriate secretion of antidiuretic hormone: Secondary | ICD-10-CM | POA: Diagnosis present

## 2014-10-13 DIAGNOSIS — R0902 Hypoxemia: Secondary | ICD-10-CM | POA: Diagnosis present

## 2014-10-13 DIAGNOSIS — R55 Syncope and collapse: Secondary | ICD-10-CM | POA: Diagnosis present

## 2014-10-13 DIAGNOSIS — E876 Hypokalemia: Secondary | ICD-10-CM | POA: Diagnosis present

## 2014-10-13 DIAGNOSIS — Z885 Allergy status to narcotic agent status: Secondary | ICD-10-CM

## 2014-10-13 DIAGNOSIS — R17 Unspecified jaundice: Secondary | ICD-10-CM | POA: Diagnosis present

## 2014-10-13 DIAGNOSIS — I959 Hypotension, unspecified: Secondary | ICD-10-CM | POA: Diagnosis not present

## 2014-10-13 DIAGNOSIS — D72829 Elevated white blood cell count, unspecified: Secondary | ICD-10-CM | POA: Diagnosis present

## 2014-10-13 DIAGNOSIS — Z8543 Personal history of malignant neoplasm of ovary: Secondary | ICD-10-CM

## 2014-10-13 DIAGNOSIS — K831 Obstruction of bile duct: Secondary | ICD-10-CM | POA: Diagnosis present

## 2014-10-13 DIAGNOSIS — Z79891 Long term (current) use of opiate analgesic: Secondary | ICD-10-CM

## 2014-10-13 DIAGNOSIS — Z8739 Personal history of other diseases of the musculoskeletal system and connective tissue: Secondary | ICD-10-CM | POA: Diagnosis not present

## 2014-10-13 DIAGNOSIS — I5031 Acute diastolic (congestive) heart failure: Secondary | ICD-10-CM | POA: Diagnosis present

## 2014-10-13 DIAGNOSIS — M1990 Unspecified osteoarthritis, unspecified site: Secondary | ICD-10-CM | POA: Diagnosis present

## 2014-10-13 DIAGNOSIS — R188 Other ascites: Secondary | ICD-10-CM | POA: Diagnosis present

## 2014-10-13 DIAGNOSIS — Z8614 Personal history of Methicillin resistant Staphylococcus aureus infection: Secondary | ICD-10-CM

## 2014-10-13 DIAGNOSIS — Z888 Allergy status to other drugs, medicaments and biological substances status: Secondary | ICD-10-CM

## 2014-10-13 DIAGNOSIS — J9811 Atelectasis: Secondary | ICD-10-CM | POA: Diagnosis present

## 2014-10-13 DIAGNOSIS — Z8507 Personal history of malignant neoplasm of pancreas: Secondary | ICD-10-CM

## 2014-10-13 DIAGNOSIS — C259 Malignant neoplasm of pancreas, unspecified: Secondary | ICD-10-CM | POA: Diagnosis present

## 2014-10-13 DIAGNOSIS — R140 Abdominal distension (gaseous): Secondary | ICD-10-CM

## 2014-10-13 DIAGNOSIS — J449 Chronic obstructive pulmonary disease, unspecified: Secondary | ICD-10-CM | POA: Diagnosis present

## 2014-10-13 DIAGNOSIS — Z515 Encounter for palliative care: Secondary | ICD-10-CM | POA: Diagnosis not present

## 2014-10-13 DIAGNOSIS — F17210 Nicotine dependence, cigarettes, uncomplicated: Secondary | ICD-10-CM | POA: Diagnosis present

## 2014-10-13 DIAGNOSIS — I4891 Unspecified atrial fibrillation: Secondary | ICD-10-CM | POA: Diagnosis present

## 2014-10-13 DIAGNOSIS — Z7951 Long term (current) use of inhaled steroids: Secondary | ICD-10-CM

## 2014-10-13 DIAGNOSIS — Z880 Allergy status to penicillin: Secondary | ICD-10-CM | POA: Diagnosis not present

## 2014-10-13 DIAGNOSIS — Z886 Allergy status to analgesic agent status: Secondary | ICD-10-CM

## 2014-10-13 DIAGNOSIS — Z79899 Other long term (current) drug therapy: Secondary | ICD-10-CM | POA: Diagnosis not present

## 2014-10-13 DIAGNOSIS — J810 Acute pulmonary edema: Secondary | ICD-10-CM | POA: Diagnosis present

## 2014-10-13 DIAGNOSIS — Z9981 Dependence on supplemental oxygen: Secondary | ICD-10-CM | POA: Diagnosis not present

## 2014-10-13 DIAGNOSIS — Z9884 Bariatric surgery status: Secondary | ICD-10-CM

## 2014-10-13 DIAGNOSIS — Z66 Do not resuscitate: Secondary | ICD-10-CM | POA: Diagnosis not present

## 2014-10-13 DIAGNOSIS — I10 Essential (primary) hypertension: Secondary | ICD-10-CM | POA: Diagnosis present

## 2014-10-13 DIAGNOSIS — R110 Nausea: Secondary | ICD-10-CM | POA: Diagnosis not present

## 2014-10-13 DIAGNOSIS — R109 Unspecified abdominal pain: Secondary | ICD-10-CM | POA: Diagnosis not present

## 2014-10-13 DIAGNOSIS — Z452 Encounter for adjustment and management of vascular access device: Secondary | ICD-10-CM

## 2014-10-13 HISTORY — DX: Malignant neoplasm of pancreas, unspecified: C25.9

## 2014-10-13 HISTORY — DX: Chronic obstructive pulmonary disease, unspecified: J44.9

## 2014-10-13 LAB — COMPREHENSIVE METABOLIC PANEL
ALT: 24 U/L (ref 14–54)
AST: 37 U/L (ref 15–41)
Albumin: 2.1 g/dL — ABNORMAL LOW (ref 3.5–5.0)
Alkaline Phosphatase: 236 U/L — ABNORMAL HIGH (ref 38–126)
Anion gap: 12 (ref 5–15)
BUN: 13 mg/dL (ref 6–20)
CHLORIDE: 98 mmol/L — AB (ref 101–111)
CO2: 20 mmol/L — ABNORMAL LOW (ref 22–32)
Calcium: 7.5 mg/dL — ABNORMAL LOW (ref 8.9–10.3)
Creatinine, Ser: 0.94 mg/dL (ref 0.44–1.00)
Glucose, Bld: 125 mg/dL — ABNORMAL HIGH (ref 65–99)
Potassium: 2.2 mmol/L — CL (ref 3.5–5.1)
Sodium: 130 mmol/L — ABNORMAL LOW (ref 135–145)
TOTAL PROTEIN: 5.4 g/dL — AB (ref 6.5–8.1)
Total Bilirubin: 3.7 mg/dL — ABNORMAL HIGH (ref 0.3–1.2)

## 2014-10-13 LAB — CBC WITH DIFFERENTIAL/PLATELET
BASOS PCT: 0 %
Basophils Absolute: 0 10*3/uL (ref 0–0.1)
Eosinophils Absolute: 0 10*3/uL (ref 0–0.7)
Eosinophils Relative: 0 %
HEMATOCRIT: 36.8 % (ref 35.0–47.0)
Hemoglobin: 12.4 g/dL (ref 12.0–16.0)
Lymphocytes Relative: 6 %
Lymphs Abs: 1 10*3/uL (ref 1.0–3.6)
MCH: 30.5 pg (ref 26.0–34.0)
MCHC: 33.8 g/dL (ref 32.0–36.0)
MCV: 90.3 fL (ref 80.0–100.0)
Monocytes Absolute: 1 10*3/uL — ABNORMAL HIGH (ref 0.2–0.9)
Monocytes Relative: 6 %
NEUTROS ABS: 14.3 10*3/uL — AB (ref 1.4–6.5)
Neutrophils Relative %: 88 %
Platelets: 203 10*3/uL (ref 150–440)
RBC: 4.08 MIL/uL (ref 3.80–5.20)
RDW: 14.1 % (ref 11.5–14.5)
WBC: 16.3 10*3/uL — AB (ref 3.6–11.0)

## 2014-10-13 LAB — BRAIN NATRIURETIC PEPTIDE: B Natriuretic Peptide: 70 pg/mL (ref 0.0–100.0)

## 2014-10-13 LAB — TROPONIN I

## 2014-10-13 LAB — MAGNESIUM: MAGNESIUM: 1.4 mg/dL — AB (ref 1.7–2.4)

## 2014-10-13 MED ORDER — FUROSEMIDE 10 MG/ML IJ SOLN
40.0000 mg | Freq: Two times a day (BID) | INTRAMUSCULAR | Status: DC
  Administered 2014-10-13 – 2014-10-14 (×2): 40 mg via INTRAVENOUS
  Filled 2014-10-13 (×2): qty 4

## 2014-10-13 MED ORDER — FENTANYL 50 MCG/HR TD PT72
75.0000 ug | MEDICATED_PATCH | TRANSDERMAL | Status: DC
  Administered 2014-10-13: 75 ug via TRANSDERMAL
  Filled 2014-10-13: qty 1

## 2014-10-13 MED ORDER — FUROSEMIDE 10 MG/ML IJ SOLN
INTRAMUSCULAR | Status: AC
  Administered 2014-10-13: 40 mg via INTRAVENOUS
  Filled 2014-10-13: qty 4

## 2014-10-13 MED ORDER — PROCHLORPERAZINE MALEATE 10 MG PO TABS
10.0000 mg | ORAL_TABLET | Freq: Three times a day (TID) | ORAL | Status: DC | PRN
  Filled 2014-10-13: qty 1

## 2014-10-13 MED ORDER — MORPHINE SULFATE 2 MG/ML IJ SOLN
INTRAMUSCULAR | Status: AC
  Administered 2014-10-13: 1 mg via INTRAVENOUS
  Filled 2014-10-13: qty 1

## 2014-10-13 MED ORDER — TIOTROPIUM BROMIDE MONOHYDRATE 18 MCG IN CAPS
18.0000 ug | ORAL_CAPSULE | Freq: Every day | RESPIRATORY_TRACT | Status: DC
  Administered 2014-10-14 – 2014-10-15 (×2): 18 ug via RESPIRATORY_TRACT
  Filled 2014-10-13: qty 5

## 2014-10-13 MED ORDER — POTASSIUM CHLORIDE 20 MEQ PO PACK
PACK | ORAL | Status: AC
  Administered 2014-10-13: 40 meq
  Filled 2014-10-13: qty 2

## 2014-10-13 MED ORDER — POTASSIUM CHLORIDE 20 MEQ PO PACK
40.0000 meq | PACK | Freq: Two times a day (BID) | ORAL | Status: DC
  Administered 2014-10-13 – 2014-10-15 (×5): 40 meq via ORAL
  Filled 2014-10-13 (×5): qty 2

## 2014-10-13 MED ORDER — BUDESONIDE-FORMOTEROL FUMARATE 160-4.5 MCG/ACT IN AERO
2.0000 | INHALATION_SPRAY | Freq: Two times a day (BID) | RESPIRATORY_TRACT | Status: DC
  Administered 2014-10-13 – 2014-10-15 (×5): 2 via RESPIRATORY_TRACT
  Filled 2014-10-13: qty 6

## 2014-10-13 MED ORDER — PANTOPRAZOLE SODIUM 40 MG PO TBEC
40.0000 mg | DELAYED_RELEASE_TABLET | Freq: Two times a day (BID) | ORAL | Status: DC
  Administered 2014-10-13 – 2014-10-15 (×4): 40 mg via ORAL
  Filled 2014-10-13 (×4): qty 1

## 2014-10-13 MED ORDER — SODIUM CHLORIDE 0.9 % IJ SOLN
3.0000 mL | Freq: Two times a day (BID) | INTRAMUSCULAR | Status: DC
  Administered 2014-10-13 – 2014-10-15 (×4): 3 mL via INTRAVENOUS

## 2014-10-13 MED ORDER — CYCLOBENZAPRINE HCL 10 MG PO TABS
10.0000 mg | ORAL_TABLET | Freq: Two times a day (BID) | ORAL | Status: DC
  Administered 2014-10-13 – 2014-10-14 (×3): 10 mg via ORAL
  Filled 2014-10-13 (×3): qty 1

## 2014-10-13 MED ORDER — NICOTINE POLACRILEX 2 MG MT GUM
2.0000 mg | CHEWING_GUM | OROMUCOSAL | Status: DC | PRN
  Filled 2014-10-13: qty 1

## 2014-10-13 MED ORDER — NYSTATIN 100000 UNIT/ML MT SUSP
5.0000 mL | Freq: Four times a day (QID) | OROMUCOSAL | Status: DC
  Administered 2014-10-13 – 2014-10-15 (×9): 500000 [IU] via ORAL
  Filled 2014-10-13 (×10): qty 5

## 2014-10-13 MED ORDER — FUROSEMIDE 10 MG/ML IJ SOLN
40.0000 mg | INTRAMUSCULAR | Status: AC
  Administered 2014-10-13: 40 mg via INTRAVENOUS

## 2014-10-13 MED ORDER — MORPHINE SULFATE 4 MG/ML IJ SOLN
4.0000 mg | Freq: Once | INTRAMUSCULAR | Status: AC
  Administered 2014-10-13: 4 mg via INTRAVENOUS

## 2014-10-13 MED ORDER — MORPHINE SULFATE 4 MG/ML IJ SOLN
INTRAMUSCULAR | Status: AC
  Administered 2014-10-13: 4 mg via INTRAVENOUS
  Filled 2014-10-13: qty 1

## 2014-10-13 MED ORDER — MORPHINE SULFATE 2 MG/ML IJ SOLN
1.0000 mg | INTRAMUSCULAR | Status: DC | PRN
  Administered 2014-10-13: 1 mg via INTRAVENOUS

## 2014-10-13 MED ORDER — ALBUTEROL SULFATE HFA 108 (90 BASE) MCG/ACT IN AERS: 2.0000 | INHALATION_SPRAY | Freq: Four times a day (QID) | RESPIRATORY_TRACT | Status: DC | PRN

## 2014-10-13 MED ORDER — URSODIOL 300 MG PO CAPS
300.0000 mg | ORAL_CAPSULE | Freq: Four times a day (QID) | ORAL | Status: DC
  Administered 2014-10-13 – 2014-10-15 (×7): 300 mg via ORAL
  Filled 2014-10-13 (×12): qty 1

## 2014-10-13 MED ORDER — DILTIAZEM HCL 25 MG/5ML IV SOLN: 10.0000 mg | Freq: Four times a day (QID) | INTRAVENOUS | Status: DC | PRN

## 2014-10-13 MED ORDER — FLUTICASONE PROPIONATE 50 MCG/ACT NA SUSP
2.0000 | Freq: Every day | NASAL | Status: DC
  Administered 2014-10-14 – 2014-10-15 (×2): 2 via NASAL
  Filled 2014-10-13: qty 16

## 2014-10-13 MED ORDER — POTASSIUM CHLORIDE CRYS ER 20 MEQ PO TBCR: 40.0000 meq | EXTENDED_RELEASE_TABLET | Freq: Once | ORAL | Status: AC

## 2014-10-13 MED ORDER — POTASSIUM CHLORIDE 10 MEQ/100ML IV SOLN
10.0000 meq | INTRAVENOUS | Status: AC
  Administered 2014-10-13 (×3): 10 meq via INTRAVENOUS
  Filled 2014-10-13 (×4): qty 100

## 2014-10-13 MED ORDER — ADULT MULTIVITAMIN W/MINERALS CH
1.0000 | ORAL_TABLET | Freq: Every day | ORAL | Status: DC
  Administered 2014-10-14 – 2014-10-15 (×2): 1 via ORAL
  Filled 2014-10-13 (×2): qty 1

## 2014-10-13 MED ORDER — MORPHINE SULFATE 2 MG/ML IJ SOLN
2.0000 mg | INTRAMUSCULAR | Status: DC | PRN
  Administered 2014-10-13 – 2014-10-14 (×3): 2 mg via INTRAVENOUS
  Filled 2014-10-13 (×3): qty 1

## 2014-10-13 MED ORDER — OXYCODONE HCL 5 MG PO TABS
20.0000 mg | ORAL_TABLET | Freq: Four times a day (QID) | ORAL | Status: DC | PRN
  Administered 2014-10-13 – 2014-10-15 (×4): 20 mg via ORAL
  Filled 2014-10-13 (×4): qty 4

## 2014-10-13 MED ORDER — DILTIAZEM HCL 60 MG PO TABS
60.0000 mg | ORAL_TABLET | Freq: Three times a day (TID) | ORAL | Status: DC
  Administered 2014-10-13 – 2014-10-14 (×3): 60 mg via ORAL
  Filled 2014-10-13 (×3): qty 1

## 2014-10-13 MED ORDER — ALBUTEROL SULFATE (2.5 MG/3ML) 0.083% IN NEBU
2.5000 mg | INHALATION_SOLUTION | Freq: Four times a day (QID) | RESPIRATORY_TRACT | Status: DC | PRN
  Administered 2014-10-13 – 2014-10-14 (×4): 2.5 mg via RESPIRATORY_TRACT
  Filled 2014-10-13 (×4): qty 3

## 2014-10-13 MED ORDER — POLYETHYLENE GLYCOL 3350 17 G PO PACK: 17.0000 g | PACK | Freq: Two times a day (BID) | ORAL | Status: DC | PRN

## 2014-10-13 NOTE — H&P (Signed)
[facility] [place] NAME: Tiffany Gamble    MR#:  [id number]  DATE OF BIRTH:  10/30/1945  DATE OF ADMISSION:  10/13/2014  PRIMARY CARE PHYSICIAN: Enid Derry, MD   REQUESTING/REFERRING PHYSICIAN: Delman Kitten M.D.  CHIEF COMPLAINT:   Chief Complaint  Patient presents with  . Shortness of Breath    x 2 days    HISTORY OF PRESENT ILLNESS: Tiffany Gamble  is a 69 y.o. female with a known history of recent diagnosis of pancreatic cancer who is currently on chemotherapy with history of gastric bypass, who was recently hospitalized and discharged on 5/28, with complaint of syncope. During that hospitalization she was noted to have obstructive jaundice ERCP attempted however was unsuccessful due to her bypass surgery. Patient had a ascites drained by IR placed. Plan was for her to have a stent placement by IR as outpatient this coming Wednesday. Patient reports that she started having shortness of breath and significant swelling involving her lower extremities and her abdomen over the past few days. She does not have any chest pains no fevers or chills. Denies any nausea vomiting or diarrhea.  PAST MEDICAL HISTORY:   Past Medical History  Diagnosis Date  . Lupus   . Staph infection     MRSA  . Arthritis   . Cancer 2016    pancreas  . Ovarian cancer 2011    left  . Pancreatic cancer     PAST SURGICAL HISTORY:  Past Surgical History  Procedure Laterality Date  . Tonsillectomy    . Appendectomy    . Hand surgery Bilateral   . Knee surgery Right   . Partial hysterectomy    . Tubal ligation    . Ventral hernia repair  03-17-10, 11-24-10    Dr Bary Castilla  . Umbilical hernia repair  02-09-11    and ventral by Dr Bary Castilla  . Laparoscopic lysis intestinal adhesions  03-09-10    Dr Bary Castilla  . Incision and drainage  09-17-10    with wound vac Dr Bary Castilla  . Abd wall hernia  09-07-10     Dr. Bary Castilla  . Hernia repair  2014    last surgery was  Dr. Duke Salvia  . Gastric bypass    . Ercp N/A 09/29/2014    Procedure: ENDOSCOPIC RETROGRADE CHOLANGIOPANCREATOGRAPHY (ERCP);  Surgeon: Lucilla Lame, MD;  Location: [facility] ENDOSCOPY;  Service: Endoscopy;  Laterality: N/A;    SOCIAL HISTORY:  History  Substance Use Topics  . Smoking status: Current Every Day Smoker -- 0.00 packs/day for 50 years  . Smokeless tobacco: Never Used  . Alcohol Use: No    FAMILY HISTORY:  Family History  Problem Relation Age of Onset  . Cancer Mother     lymphoma  . Heart attack Father     DRUG ALLERGIES:  Allergies  Allergen Reactions  . Acetaminophen Other (See Comments)    Reaction: Affects liver functioning  . Clotrimazole Other (See Comments)    Reaction: Unknown  . Codeine Hives and Other (See Comments)    Reaction: Weal  . Fluticasone Furoate-Vilanterol Other (See Comments)    Reaction: Thrush  . Hydrocodone-Acetaminophen Itching and Other (See Comments)    Reaction: Weal  . Ibuprofen Other (See Comments)    Reaction: Affects liver functioning  . Prednisone Swelling and Other (See Comments)    Reaction: Throat swelling  . Penicillin G Rash  . Penicillins Rash    REVIEW OF SYSTEMS:   CONSTITUTIONAL:  No fever, positive fatigue and positive weakness.  EYES: No blurred or double vision.  EARS, NOSE, AND THROAT: No tinnitus or ear pain.  RESPIRATORY: No cough, positive shortness of breath, wheezing or hemoptysis.  CARDIOVASCULAR: No chest pain, orthopnea, edema.  GASTROINTESTINAL: No nausea, vomiting, diarrhea or positive abdominal pain. Positive distention GENITOURINARY: No dysuria, hematuria.  ENDOCRINE: No polyuria, nocturia,  HEMATOLOGY: No anemia, easy bruising or bleeding SKIN: No rash or lesion. MUSCULOSKELETAL: No joint pain or arthritis.   NEUROLOGIC: No tingling, numbness, weakness.  PSYCHIATRY: No anxiety or depression.   MEDICATIONS AT HOME:  Prior to Admission medications   Medication Sig Start Date End Date Taking?  Authorizing Provider  albuterol (PROVENTIL HFA;VENTOLIN HFA) 108 (90 BASE) MCG/ACT inhaler Inhale 2 puffs into the lungs every 6 (six) hours as needed for wheezing or shortness of breath.   Yes Historical Provider, MD  budesonide-formoterol (SYMBICORT) 160-4.5 MCG/ACT inhaler Inhale 2 puffs into the lungs 2 (two) times daily. 10/04/14  Yes Gladstone Lighter, MD  cyclobenzaprine (FLEXERIL) 10 MG tablet Take 1 tablet (10 mg total) by mouth 2 (two) times daily. 09/23/14  Yes Lloyd Huger, MD  fentaNYL (DURAGESIC - DOSED MCG/HR) 75 MCG/HR Place 75 mcg onto the skin every 3 (three) days.   Yes Historical Provider, MD  fluticasone (FLONASE) 50 MCG/ACT nasal spray Place 2 sprays into both nostrils daily.   Yes Historical Provider, MD  furosemide (LASIX) 40 MG tablet Take 1 tablet (40 mg total) by mouth daily. 09/08/14  Yes Lloyd Huger, MD  Multiple Vitamins-Minerals (MULTIVITAMIN ADULT PO) Take 1 tablet by mouth daily.   Yes Historical Provider, MD  Oxycodone HCl 20 MG TABS Take 1 tablet (20 mg total) by mouth 4 (four) times daily as needed. 09/17/14  Yes Lloyd Huger, MD  pantoprazole (PROTONIX) 40 MG tablet Take 40 mg by mouth 2 (two) times daily.   Yes Historical Provider, MD  polyethylene glycol (MIRALAX / GLYCOLAX) packet Take 17 g by mouth 2 (two) times daily as needed for mild constipation.    Yes Historical Provider, MD  potassium chloride (KLOR-CON) 20 MEQ packet Take 40 mEq by mouth 2 (two) times daily. 10/05/14  Yes Evlyn Kanner, NP  prochlorperazine (COMPAZINE) 10 MG tablet Take 10 mg by mouth every 8 (eight) hours as needed for nausea or vomiting.    Yes Historical Provider, MD  tiotropium (SPIRIVA) 18 MCG inhalation capsule Place 18 mcg into inhaler and inhale daily.   Yes Historical Provider, MD  ursodiol (ACTIGALL) 300 MG capsule Take 300 mg by mouth 4 (four) times daily.   Yes Historical Provider, MD  fentaNYL (DURAGESIC - DOSED MCG/HR) 50 MCG/HR Place 1 patch (50 mcg total)  onto the skin every 3 (three) days. Patient not taking: Reported on 10/13/2014 09/17/14   Lloyd Huger, MD  lidocaine-prilocaine (EMLA) cream Apply 1 application topically as needed. Apply to port then cover with saran wrap 1-2 hours prior to chemotherapy Patient not taking: Reported on 10/13/2014 09/10/14   Lloyd Huger, MD      PHYSICAL EXAMINATION:   VITAL SIGNS: Blood pressure 97/59, pulse 117, temperature 97.6 F (36.4 C), temperature source Oral, resp. rate 22, height 5\' 1"  (1.549 m), weight 83.462 kg (184 lb), SpO2 100 %.  GENERAL:  69 y.o.-year-old patient lying in the bed with no acute distress.  EYES: Pupils equal, round, reactive to light and accommodation. No scleral icterus. Extraocular muscles intact.  HEENT: Head atraumatic, normocephalic. Oropharynx and nasopharynx  clear.  NECK:  Supple, no jugular venous distention. No thyroid enlargement, no tenderness.  LUNGS: Normal breath sounds bilaterally, no wheezing, rales,rhonchi or crepitation. No use of accessory muscles of respiration.  CARDIOVASCULAR: S1, S2 normal. No murmurs, rubs, or gallops.  ABDOMEN: Soft, nontender, significantly distended with ascites Bowel sounds present. No organomegaly or mass.  EXTREMITIES: 2+ edema, cyanosis, or clubbing.  NEUROLOGIC: Cranial nerves II through XII are intact. Muscle strength 5/5 in all extremities. Sensation intact. Gait not checked.  PSYCHIATRIC: The patient is alert and oriented x 3.  SKIN: No obvious rash, lesion, or ulcer.   LABORATORY PANEL:   CBC  Recent Labs Lab 10/13/14 1135  WBC 16.3*  HGB 12.4  HCT 36.8  PLT 203  MCV 90.3  MCH 30.5  MCHC 33.8  RDW 14.1  LYMPHSABS 1.0  MONOABS 1.0*  EOSABS 0.0  BASOSABS 0.0   ------------------------------------------------------------------------------------------------------------------  Chemistries   Recent Labs Lab 10/13/14 1135  NA 130*  K 2.2*  CL 98*  CO2 20*  GLUCOSE 125*  BUN 13  CREATININE 0.94   CALCIUM 7.5*  MG 1.4*  AST 37  ALT 24  ALKPHOS 236*  BILITOT 3.7*   ------------------------------------------------------------------------------------------------------------------ estimated creatinine clearance is 56.9 mL/min (by C-G formula based on Cr of 0.94). ------------------------------------------------------------------------------------------------------------------ No results for input(s): TSH, T4TOTAL, T3FREE, THYROIDAB in the last 72 hours.  Invalid input(s): FREET3   Coagulation profile No results for input(s): INR, PROTIME in the last 168 hours. ------------------------------------------------------------------------------------------------------------------- No results for input(s): DDIMER in the last 72 hours. -------------------------------------------------------------------------------------------------------------------  Cardiac Enzymes  Recent Labs Lab 10/13/14 1135  TROPONINI <0.03   ------------------------------------------------------------------------------------------------------------------ Invalid input(s): POCBNP  ---------------------------------------------------------------------------------------------------------------  Urinalysis    Component Value Date/Time   COLORURINE AMBER* 09/28/2014 1902   APPEARANCEUR CLEAR* 09/28/2014 1902   LABSPEC 1.012 09/28/2014 1902   PHURINE 6.0 09/28/2014 1902   GLUCOSEU NEGATIVE 09/28/2014 1902   HGBUR NEGATIVE 09/28/2014 1902   BILIRUBINUR 2+* 09/28/2014 1902   KETONESUR NEGATIVE 09/28/2014 1902   PROTEINUR NEGATIVE 09/28/2014 1902   NITRITE NEGATIVE 09/28/2014 1902   LEUKOCYTESUR NEGATIVE 09/28/2014 1902     RADIOLOGY: Dg Chest 2 View  10/13/2014   CLINICAL DATA:  Shortness of breath. History of pancreatic carcinoma.  EXAM: CHEST - 2 VIEW  COMPARISON:  07/22/2014  FINDINGS: Stable positioning of Port-A-Cath. Bibasilar atelectasis present, right greater than left. Mild interstitial  prominence present in both lungs which appears slightly more prominent. This may be consistent with minimal interstitial edema and is not associated with alveolar edema or pleural fluid. The heart size and mediastinal contours are normal. No bony abnormalities are seen.  IMPRESSION: Mild increased can bilateral pulmonary interstitial prominence. This may be consistent with mild interstitial edema.   Electronically Signed   By: Aletta Edouard M.D.   On: 10/13/2014 11:39    EKG: Orders placed or performed during the hospital encounter of 10/13/14  . ED EKG  . ED EKG    IMPRESSION AND PLAN: Patient is a 69 year old white female with history of pancreatic cancer recent obstructive jaundice status post drain tube placed now presents with shortness of breath abdominal swelling>  1. Shortness of breath and swelling of her lower extremities: Due to her pancreatic cancer, low albumen state possible diastolic congestive heart failure. At this time will treat her with IV Lasix to remove some of this fluid. I will have interventional radiology drain her ascitic fluid.  2. Atrial fibrillation: New onset, patient not a candidate for anticoagulation, I'll start her  on by mouth Cardizem, when necessary IV Cardizem, check a TSH. Replace her electrolytes  3 . Severe hypokalemia: Replace her potassium orally and IV.  4. COPD: Continue nebulizers,  Symbicort and Spiriva as doing at home  5.  Pancreatic cancer: Likely progressive, palliative care consult to address CODE STATUS continue pain control  6. Nicotine addiction smoking cessation provided to the patient for minutes spent nicotine replacement will be offered strongly recommended she stop smoking.,   All the records are reviewed and case discussed with ED provider. Management plans discussed with the patient, family and they are in agreement.  CODE STATUS:    Code Status Orders        Start     Ordered   10/13/14 1313  Full code   Continuous      10/13/14 1314       TOTAL TIME TAKING CARE OF THIS PATIENT: 55 minutes.    Dustin Flock M.D on 10/13/2014 at 2:50 PM  Between 7am to 6pm - Pager - [phone]  After 6pm go to [email or web address] - password [email or web address] [facility]  Office  [phone]  CC: Primary care physician; Enid Derry, MD

## 2014-10-13 NOTE — Progress Notes (Signed)
*  PRELIMINARY RESULTS* Echocardiogram 2D Echocardiogram has been performed.  [place] 10/13/2014, 6:28 PM

## 2014-10-13 NOTE — ED Notes (Signed)
History of pancreatic cancer, SOB x 2 days, per family pt retaining fluid for same time

## 2014-10-13 NOTE — ED Notes (Signed)
Dr. Jacqualine Code notified of K+ of 2.2

## 2014-10-13 NOTE — Consult Note (Signed)
Palliative Medicine Inpatient Consult Note   Name: Tiffany Gamble Date: 10/13/2014 MRN: [id number]  DOB: [birth year]  Referring Physician: Dustin Flock, MD  Palliative Care consult requested for this 69 y.o. female for goals of medical therapy in patient with pancreatic cancer, recurrent ascites, COPD, HTN, and h/o lupus.  Pt admitted for recurrent ascites  Tiffany Gamble is a 69 y.o. female with pancreatic cancer, recurrent ascites, COPD, HTN, h/o gastric bypass. and h/o lupus. Pt presented to ER from home due to increasing SOB. ER workup significant for hyperbilirubinemia, leukocytosis, hypokalemia and ascites. Pt admitted for evaluation and treatment. Family at bedside.  Pt currently resting in bed, eating dinner and in NAD.      REVIEW OF SYSTEMS:  Pain: None Dyspnea:  No Nausea/Vomiting:  No Fatigue:   Yes All other systems were reviewed and found to be negative  SPIRITUAL SUPPORT SYSTEM: Yes.  SOCIAL HISTORY:  reports that she has been smoking.  She has never used smokeless tobacco. She reports that she does not drink alcohol or use illicit drugs.  LEGAL DOCUMENTS:  Advance Directives:  Yes. Health Care Power of Attorney:  Yes.  CODE STATUS: DNR  PAST MEDICAL HISTORY: Past Medical History  Diagnosis Date  . Lupus   . Staph infection     MRSA  . Arthritis   . Cancer 2016    pancreas  . Ovarian cancer 2011    left  . Pancreatic cancer   . COPD (chronic obstructive pulmonary disease)     PAST SURGICAL HISTORY:  Past Surgical History  Procedure Laterality Date  . Tonsillectomy    . Appendectomy    . Hand surgery Bilateral   . Knee surgery Right   . Partial hysterectomy    . Tubal ligation    . Ventral hernia repair  03-17-10, 11-24-10    Dr Bary Castilla  . Umbilical hernia repair  02-09-11    and ventral by Dr Bary Castilla  . Laparoscopic lysis intestinal adhesions  03-09-10    Dr Bary Castilla  . Incision and drainage  09-17-10    with wound vac Dr Bary Castilla  . Abd  wall hernia  09-07-10     Dr. Bary Castilla  . Hernia repair  2014    last surgery was Dr. Duke Salvia  . Gastric bypass    . Ercp N/A 09/29/2014    Procedure: ENDOSCOPIC RETROGRADE CHOLANGIOPANCREATOGRAPHY (ERCP);  Surgeon: Lucilla Lame, MD;  Location: [facility] ENDOSCOPY;  Service: Endoscopy;  Laterality: N/A;    ALLERGIES:  is allergic to acetaminophen; clotrimazole; codeine; fluticasone furoate-vilanterol; hydrocodone-acetaminophen; ibuprofen; prednisone; penicillin g; and penicillins.  MEDICATIONS:  Current Facility-Administered Medications  Medication Dose Route Frequency Provider Last Rate Last Dose  . albuterol (PROVENTIL) (2.5 MG/3ML) 0.083% nebulizer solution 2.5 mg  2.5 mg Nebulization Q6H PRN Dustin Flock, MD      . budesonide-formoterol (SYMBICORT) 160-4.5 MCG/ACT inhaler 2 puff  2 puff Inhalation BID Dustin Flock, MD      . cyclobenzaprine (FLEXERIL) tablet 10 mg  10 mg Oral BID Dustin Flock, MD      . diltiazem (CARDIZEM) injection 10 mg  10 mg Intravenous Q6H PRN Dustin Flock, MD      . diltiazem (CARDIZEM) tablet 60 mg  60 mg Oral 3 times per day Dustin Flock, MD      . fentaNYL ([place] - dosed mcg/hr) patch 75 mcg  75 mcg Transdermal Q72H Dustin Flock, MD      . Derrill Memo ON 10/14/2014] fluticasone (FLONASE) 50 MCG/ACT nasal spray 2  spray  2 spray Each Nare Daily Dustin Flock, MD      . furosemide (LASIX) injection 40 mg  40 mg Intravenous BID Dustin Flock, MD      . morphine 2 MG/ML injection 1 mg  1 mg Intravenous Q4H PRN Dustin Flock, MD   1 mg at 10/13/14 1341  . multivitamin with minerals tablet 1 tablet  1 tablet Oral Daily Dustin Flock, MD      . nicotine polacrilex (NICORETTE) gum 2 mg  2 mg Oral PRN Dustin Flock, MD      . nystatin (MYCOSTATIN) 100000 UNIT/ML suspension 500,000 Units  5 mL Oral QID Dustin Flock, MD      . oxyCODONE (Oxy IR/ROXICODONE) immediate release tablet 20 mg  20 mg Oral QID PRN Dustin Flock, MD      . pantoprazole (PROTONIX) EC tablet  40 mg  40 mg Oral BID Dustin Flock, MD      . polyethylene glycol (MIRALAX / GLYCOLAX) packet 17 g  17 g Oral BID PRN Dustin Flock, MD      . potassium chloride (KLOR-CON) packet 40 mEq  40 mEq Oral BID Dustin Flock, MD      . potassium chloride 10 mEq in 100 mL IVPB  10 mEq Intravenous Q1 Hr x 4 Shreyang Patel, MD      . prochlorperazine (COMPAZINE) tablet 10 mg  10 mg Oral Q8H PRN Dustin Flock, MD      . sodium chloride 0.9 % injection 3 mL  3 mL Intravenous Q12H Dustin Flock, MD      . Derrill Memo ON 10/14/2014] tiotropium (SPIRIVA) inhalation capsule 18 mcg  18 mcg Inhalation Daily Dustin Flock, MD      . ursodiol (ACTIGALL) capsule 300 mg  300 mg Oral QID Dustin Flock, MD       Facility-Administered Medications Ordered in Other Encounters  Medication Dose Route Frequency Provider Last Rate Last Dose  . sodium chloride 0.9 % injection 10 mL  10 mL Intravenous PRN Lloyd Huger, MD   10 mL at 09/10/14 1030    Vital Signs: BP 97/59 mmHg  Pulse 117  Temp(Src) 97.6 F (36.4 C) (Oral)  Resp 22  Ht 5\' 1"  (1.549 m)  Wt 100.789 kg (222 lb 3.2 oz)  BMI 42.01 kg/m2  SpO2 100% Filed Weights   10/13/14 1044 10/13/14 1500  Weight: 83.462 kg (184 lb) 100.789 kg (222 lb 3.2 oz)    Estimated body mass index is 42.01 kg/(m^2) as calculated from the following:   Height as of this encounter: 5\' 1"  (1.549 m).   Weight as of this encounter: 100.789 kg (222 lb 3.2 oz).  PERFORMANCE STATUS (ECOG) : 3 - Symptomatic, >50% confined to bed  PHYSICAL EXAM: General: ill appearing HEENT: OP clear, moist oral mucosa, sclera yellow Neck: Trachea midline  Cardiovascular: RRR Pulmonary/Chest: Clear ant fields Abdominal: Soft, NTTP, distended Hypoactive bowel sounds, drain noted to RUQ Extremities: No edema  Neurological: Grossly nonfocal Skin: Warm, dry and yellow Psychiatric: Calm, A&O x 3  LABS: CBC:    Component Value Date/Time   WBC 16.3* 10/13/2014 1135   WBC 1.5* 09/01/2014  1959   HGB 12.4 10/13/2014 1135   HGB 9.5* 09/01/2014 1959   HCT 36.8 10/13/2014 1135   HCT 28.5* 09/01/2014 1959   PLT 203 10/13/2014 1135   PLT 97* 09/01/2014 1959   MCV 90.3 10/13/2014 1135   MCV 91 09/01/2014 1959   NEUTROABS 14.3* 10/13/2014 1135   NEUTROABS  2.7 08/27/2014 0907   LYMPHSABS 1.0 10/13/2014 1135   LYMPHSABS 0.6* 08/27/2014 0907   MONOABS 1.0* 10/13/2014 1135   MONOABS 0.3 08/27/2014 0907   EOSABS 0.0 10/13/2014 1135   EOSABS 0.1 08/27/2014 0907   BASOSABS 0.0 10/13/2014 1135   BASOSABS 0.0 08/27/2014 0907   Comprehensive Metabolic Panel:    Component Value Date/Time   NA 130* 10/13/2014 1135   NA 132* 09/01/2014 1959   K 2.2* 10/13/2014 1135   K 3.4* 09/01/2014 1959   CL 98* 10/13/2014 1135   CL 100* 09/01/2014 1959   CO2 20* 10/13/2014 1135   CO2 27 09/01/2014 1959   BUN 13 10/13/2014 1135   BUN 8 09/01/2014 1959   CREATININE 0.94 10/13/2014 1135   CREATININE 0.53 09/01/2014 1959   GLUCOSE 125* 10/13/2014 1135   GLUCOSE 107* 09/01/2014 1959   CALCIUM 7.5* 10/13/2014 1135   CALCIUM 8.0* 09/01/2014 1959   AST 37 10/13/2014 1135   AST 42* 09/01/2014 1959   ALT 24 10/13/2014 1135   ALT 35 09/01/2014 1959   ALKPHOS 236* 10/13/2014 1135   ALKPHOS 325* 09/01/2014 1959   BILITOT 3.7* 10/13/2014 1135   PROT 5.4* 10/13/2014 1135   PROT 5.6* 09/01/2014 1959   ALBUMIN 2.1* 10/13/2014 1135   ALBUMIN 2.3* 09/01/2014 1959    IMPRESSION: Tiffany Gamble is a 69 y.o. female with pancreatic cancer, recurrent ascites, COPD, HTN, h/o gastric bypass. and h/o lupus. Pt presented to ER from home due to increasing SOB. ER workup significant for hyperbilirubinemia, leukocytosis, hypokalemia and ascites. Pt admitted for evaluation and treatment. Family at bedside.  Pt well known to me from last hospitalization.  Pt currently in NAD. Pt states she has had increasing SOB since last hospitalization due to ascites.  Pt wants to speak with oncologist to discuss chemo  options and daughter in law at bedside adamantly states "No Hospice Services".  Spoke with pt about our previous discussion about code status and pt wants to remain DNR.  Will continue to follow pt for symptom management and goals of care.  PLAN: 1. DNR     More than 50% of the visit was spent in counseling/coordination of care: Yes  Time Spent: 75 minutes

## 2014-10-13 NOTE — ED Notes (Signed)
Pt states hx of pancreatic cancer, pt scheduled for a liver stent on Wednsday, pt has bilary drain in place, pt SOB upon assessment, labored work of breathing, pt wheezing upon assessment

## 2014-10-13 NOTE — ED Notes (Signed)
Bilateral lower edema swelling in lower extremities

## 2014-10-13 NOTE — Progress Notes (Signed)
   10/13/14 1700  Clinical Encounter Type  Visited With Patient and family together  Visit Type Initial  Spiritual Encounters  Spiritual Needs Prayer  Stress Factors  Patient Stress Factors Health changes  Family Stress Factors None identified    Faith tradition:  Status: Coping with Pancreatic cancer and anticipating breathing treatment Family present: Fiance beside Assessment: Chaplain visited patient and offered encouraging words. She expressed that she was not feeling well today. The patient and her family will be lifted up in prayer.  For any pastoral care needs, the chaplains are available 24x7 and can be reached by submitting a referral or via on-call pager at [phone]

## 2014-10-13 NOTE — ED Provider Notes (Signed)
[facility] Emergency Department Provider Note  ____________________________________________  Time seen: Approximately 11:40 AM  I have reviewed the triage vital signs and the nursing notes.   HISTORY  Chief Complaint Shortness of Breath    HPI Tiffany Gamble is a 69 y.o. female history of pancreatic cancer, ascites, and multiple other medical comorbidities brief presents today with increasing shortness of breath. She states that she is "swelling" and that she has "too much fluid". She has been tasting Lasix at home, but states that this is not helping and her urine is actually decreasing. She does have pain from the swelling in her lower abdomen, she denies any chest pain. She does feel short of breath. She is especially short of breath she tries to lay down. She is also been having a cough productive of some scant white sputum.  Of pertinence, patient was recently admitted to the hospital and discharged about 7 date days ago. I have reviewed that chart. In addition, she has had multiple paracenteses for ascites. In addition she has a biliary drain. She was told she needed a pancreatic stent placed, but they're unable to do this.  She is currently not on chemotherapy, she follows with Dr. Grayland Ormond and she has been previously treated but states that her cancer does not seem to be getting better despite chemotherapy.   Past Medical History  Diagnosis Date  . Lupus   . Staph infection     MRSA  . Arthritis   . Cancer 2016    pancreas  . Ovarian cancer 2011    left  . Pancreatic cancer     Patient Active Problem List   Diagnosis Date Noted  . Ascites   . Hyperbilirubinemia   . Jaundice   . Obstructive hyperbilirubinemia 09/28/2014  . COPD (chronic obstructive pulmonary disease) 09/28/2014  . Syncope 09/28/2014  . Pancreatic cancer 07/17/2014  . Cancer of pancreas, body 07/14/2014  . Amnesia 06/19/2014  . Mild cognitive disorder 06/19/2014  .  Anxiety 01/03/2014  . Carotid artery plaque 01/03/2014  . Cancer 01/03/2014  . Acid reflux 01/03/2014  . Addiction, opium 01/03/2014    Past Surgical History  Procedure Laterality Date  . Tonsillectomy    . Appendectomy    . Hand surgery Bilateral   . Knee surgery Right   . Partial hysterectomy    . Tubal ligation    . Ventral hernia repair  03-17-10, 11-24-10    Dr Bary Castilla  . Umbilical hernia repair  02-09-11    and ventral by Dr Bary Castilla  . Laparoscopic lysis intestinal adhesions  03-09-10    Dr Bary Castilla  . Incision and drainage  09-17-10    with wound vac Dr Bary Castilla  . Abd wall hernia  09-07-10     Dr. Bary Castilla  . Hernia repair  2014    last surgery was Dr. Duke Salvia  . Gastric bypass    . Ercp N/A 09/29/2014    Procedure: ENDOSCOPIC RETROGRADE CHOLANGIOPANCREATOGRAPHY (ERCP);  Surgeon: Lucilla Lame, MD;  Location: [facility] ENDOSCOPY;  Service: Endoscopy;  Laterality: N/A;    Current Outpatient Rx  Name  Route  Sig  Dispense  Refill  . budesonide-formoterol (SYMBICORT) 160-4.5 MCG/ACT inhaler   Inhalation   Inhale 2 puffs into the lungs 2 (two) times daily.   1 Inhaler   2   . cyclobenzaprine (FLEXERIL) 10 MG tablet   Oral   Take 1 tablet (10 mg total) by mouth 2 (two) times daily.   [place]  tablet   3   . donepezil (ARICEPT) 5 MG tablet   Oral   Take 5 mg by mouth at bedtime.       2   . fentaNYL (DURAGESIC - DOSED MCG/HR) 50 MCG/HR   Transdermal   Place 1 patch (50 mcg total) onto the skin every 3 (three) days.   10 patch   0   . fluticasone (FLONASE) 50 MCG/ACT nasal spray   Each Nare   Place 2 sprays into both nostrils daily.       11   . furosemide (LASIX) 40 MG tablet   Oral   Take 1 tablet (40 mg total) by mouth daily.   30 tablet   2   . lidocaine-prilocaine (EMLA) cream   Topical   Apply 1 application topically as needed. Apply to port then cover with saran wrap 1-2 hours prior to chemotherapy   30 g   1   . Multiple Vitamin (MULTI-VITAMINS) TABS    Oral   Take by mouth daily.          . Oxycodone HCl 20 MG TABS   Oral   Take 1 tablet (20 mg total) by mouth 4 (four) times daily as needed.   180 tablet   0   . pantoprazole (PROTONIX) 40 MG tablet   Oral   Take 40 mg by mouth 2 (two) times daily.       5   . polyethylene glycol powder (GLYCOLAX/MIRALAX) powder   Oral   Take by mouth 2 (two) times daily.       5   . potassium chloride (KLOR-CON) 20 MEQ packet   Oral   Take 40 mEq by mouth 2 (two) times daily.   50 packet   0   . prochlorperazine (COMPAZINE) 10 MG tablet   Oral   Take 10 mg by mouth every 8 (eight) hours as needed.          Marland Kitchen SPIRIVA HANDIHALER 18 MCG inhalation capsule   Inhalation   Place 18 mcg into inhaler and inhale daily.       3     Dispense as written.   . ursodiol (ACTIGALL) 300 MG capsule   Oral   Take by mouth 4 (four) times daily.          . VENTOLIN HFA 108 (90 BASE) MCG/ACT inhaler   Inhalation   Inhale 2 puffs into the lungs every 6 (six) hours as needed.       12     Dispense as written.     Allergies Acetaminophen; Clotrimazole; Codeine; Fluticasone furoate-vilanterol; Hydrocodone-acetaminophen; Ibuprofen; Prednisone; Penicillin g; and Penicillins  Family History  Problem Relation Age of Onset  . Cancer Mother     lymphoma  . Heart attack Father     Social History History  Substance Use Topics  . Smoking status: Current Every Day Smoker -- 0.00 packs/day for 50 years  . Smokeless tobacco: Never Used  . Alcohol Use: No    Review of Systems Constitutional: No fever/chills Eyes: No visual changes. ENT: No sore throat. Cardiovascular: Denies chest pain. Respiratory: Moderate shortness of breath, especially worse when she lays flat. Denies wheezing. Gastrointestinal: Abdominal fullness. No nausea, no vomiting.  No diarrhea.  No constipation. Genitourinary: Negative for dysuria. She has been taking Lasix, but urine output is decreasing. Musculoskeletal:  Negative for back pain. Skin: Negative for rash. Neurological: Negative for headaches, focal weakness or numbness.  10-point ROS otherwise negative.  ____________________________________________   PHYSICAL EXAM:  VITAL SIGNS: ED Triage Vitals  Enc Vitals Group     BP 10/13/14 1044 114/70 mmHg     Pulse Rate 10/13/14 1044 115     Resp 10/13/14 1044 20     Temp 10/13/14 1044 97.6 F (36.4 C)     Temp Source 10/13/14 1044 Oral     SpO2 10/13/14 1044 98 %     Weight 10/13/14 1044 184 lb (83.462 kg)     Height 10/13/14 1044 5\' 1"  (1.549 m)     Head Cir --      Peak Flow --      Pain Score 10/13/14 1046 10     Pain Loc --      Pain Edu? --      Excl. in [place]? --     Constitutional: Alert and oriented. Is a jaundiced and very ill-appearing female. This illness appears to be quite chronic, right now she does exhibit mild increased work of breathing with accessory muscle use.   Head: Atraumatic. Nose: No congestion/rhinnorhea. Mouth/Throat: Mucous membranes are moist.  Oropharynx non-erythematous. Neck: No stridor.   Cardiovascular: Normal rate, regular rhythm. Grossly normal heart sounds.  Good peripheral circulation. Respiratory: The patient has rales in the bases bilaterally, she has moderate use of accessory muscles, she does demonstrate mild dyspnea. She is in no acute extremities, but does appear to have increased work of breathing. Gastrointestinal: Moderate distention with minimal fluid wave. There is a T-tube in the right upper quadrant draining green fluid. No abdominal bruits. No CVA tenderness. There is no acute tenderness in the abdomen. Musculoskeletal: 4+ lower extremity edema. Anasarca. No joint effusions. Neurologic:  Normal speech and language. No gross focal neurologic deficits are appreciated. Speech is normal. No gait instability. Skin:  Skin is warm, dry and intact. No rash noted. Psychiatric: Mood and affect are normal. Speech and behavior are  normal.  ____________________________________________   LABS (all labs ordered are listed, but only abnormal results are displayed)  Labs Reviewed  COMPREHENSIVE METABOLIC PANEL - Abnormal; Notable for the following:    Sodium 130 (*)    Potassium 2.2 (*)    Chloride 98 (*)    CO2 20 (*)    Glucose, Bld 125 (*)    Calcium 7.5 (*)    Total Protein 5.4 (*)    Albumin 2.1 (*)    Alkaline Phosphatase 236 (*)    Total Bilirubin 3.7 (*)    All other components within normal limits  CBC WITH DIFFERENTIAL/PLATELET - Abnormal; Notable for the following:    WBC 16.3 (*)    Neutro Abs 14.3 (*)    Monocytes Absolute 1.0 (*)    All other components within normal limits  TROPONIN I  BRAIN NATRIURETIC PEPTIDE   ____________________________________________  EKG  EKG demonstrates atrial fibrillation with rapid ventricular response reviewed and interpreted by me. Nonspecific ST abnormality. There is no evidence of acute ST elevation. Ventricular rate 120 QRS 92 QT 544  There is some baseline artifact, which makes assurance of A. fib difficult but based on the irregularity and the lack of P waves this is likely her presenting rhythm. ____________________________________________  RADIOLOGY  Mild increased can bilateral pulmonary interstitial prominence. This may be consistent with mild interstitial edema. ____________________________________________   PROCEDURES  Procedure(s) performed: None  Critical Care performed: No  ____________________________________________   INITIAL IMPRESSION / ASSESSMENT AND PLAN / ED COURSE  Pertinent labs & imaging results that were available during  my care of the patient were reviewed by me and considered in my medical decision making (see chart for details).  Patient presents with symptoms that are most consistent with volume overload. This is likely secondary to her pre-existing condition and pancreatic cancer. I suspect she has recurrent  ascites, also may now be developing some congestive heart failure symptoms/ulnar edema. She is on Lasix and is not diuresing well. She has anasarca. Because of her symptoms, ascites, and increased work of breathing, I anticipate the patient will require admission. She has no acute coronary syndrome symptoms. We will obtain EKG, chest x-ray, labs including BNP. Anticipate consultation to hospitalist service for coordination of care and reevaluation.  Discussed case and care with Dr. Posey Pronto the hospitalist service will perform admission. ____________________________________________   FINAL CLINICAL IMPRESSION(S) / ED DIAGNOSES  Final diagnoses:  Ascites  Jaundice  Hypokalemia  Acute pulmonary edema   atrial fibrillation    Delman Kitten, MD 10/13/14 1237

## 2014-10-13 NOTE — ED Notes (Signed)
Pt placed on 3L [place]

## 2014-10-14 ENCOUNTER — Inpatient Hospital Stay: Payer: Medicare Other

## 2014-10-14 DIAGNOSIS — C259 Malignant neoplasm of pancreas, unspecified: Principal | ICD-10-CM

## 2014-10-14 DIAGNOSIS — R531 Weakness: Secondary | ICD-10-CM

## 2014-10-14 DIAGNOSIS — R5383 Other fatigue: Secondary | ICD-10-CM

## 2014-10-14 DIAGNOSIS — R109 Unspecified abdominal pain: Secondary | ICD-10-CM

## 2014-10-14 DIAGNOSIS — I959 Hypotension, unspecified: Secondary | ICD-10-CM

## 2014-10-14 DIAGNOSIS — Z8614 Personal history of Methicillin resistant Staphylococcus aureus infection: Secondary | ICD-10-CM

## 2014-10-14 DIAGNOSIS — Z8543 Personal history of malignant neoplasm of ovary: Secondary | ICD-10-CM

## 2014-10-14 DIAGNOSIS — Z807 Family history of other malignant neoplasms of lymphoid, hematopoietic and related tissues: Secondary | ICD-10-CM

## 2014-10-14 DIAGNOSIS — M129 Arthropathy, unspecified: Secondary | ICD-10-CM

## 2014-10-14 DIAGNOSIS — R0602 Shortness of breath: Secondary | ICD-10-CM

## 2014-10-14 DIAGNOSIS — R188 Other ascites: Secondary | ICD-10-CM

## 2014-10-14 DIAGNOSIS — J449 Chronic obstructive pulmonary disease, unspecified: Secondary | ICD-10-CM

## 2014-10-14 DIAGNOSIS — M329 Systemic lupus erythematosus, unspecified: Secondary | ICD-10-CM

## 2014-10-14 DIAGNOSIS — R140 Abdominal distension (gaseous): Secondary | ICD-10-CM

## 2014-10-14 DIAGNOSIS — F17210 Nicotine dependence, cigarettes, uncomplicated: Secondary | ICD-10-CM

## 2014-10-14 DIAGNOSIS — Z79899 Other long term (current) drug therapy: Secondary | ICD-10-CM

## 2014-10-14 DIAGNOSIS — R110 Nausea: Secondary | ICD-10-CM

## 2014-10-14 DIAGNOSIS — Z9884 Bariatric surgery status: Secondary | ICD-10-CM

## 2014-10-14 LAB — COMPREHENSIVE METABOLIC PANEL
ALBUMIN: 1.9 g/dL — AB (ref 3.5–5.0)
ALT: 21 U/L (ref 14–54)
AST: 24 U/L (ref 15–41)
Alkaline Phosphatase: 220 U/L — ABNORMAL HIGH (ref 38–126)
Anion gap: 10 (ref 5–15)
BILIRUBIN TOTAL: 3.5 mg/dL — AB (ref 0.3–1.2)
BUN: 16 mg/dL (ref 6–20)
CALCIUM: 7.5 mg/dL — AB (ref 8.9–10.3)
CHLORIDE: 99 mmol/L — AB (ref 101–111)
CO2: 21 mmol/L — AB (ref 22–32)
Creatinine, Ser: 1.14 mg/dL — ABNORMAL HIGH (ref 0.44–1.00)
GFR calc Af Amer: 56 mL/min — ABNORMAL LOW (ref >60)
GFR, EST NON AFRICAN AMERICAN: 49 mL/min — AB (ref >60)
Glucose, Bld: 126 mg/dL — ABNORMAL HIGH (ref 65–99)
Potassium: 3.1 mmol/L — ABNORMAL LOW (ref 3.5–5.1)
Sodium: 130 mmol/L — ABNORMAL LOW (ref 135–145)
TOTAL PROTEIN: 5 g/dL — AB (ref 6.5–8.1)

## 2014-10-14 LAB — CBC
HCT: 33.7 % — ABNORMAL LOW (ref 35.0–47.0)
Hemoglobin: 11.5 g/dL — ABNORMAL LOW (ref 12.0–16.0)
MCH: 30.4 pg (ref 26.0–34.0)
MCHC: 34.3 g/dL (ref 32.0–36.0)
MCV: 88.5 fL (ref 80.0–100.0)
Platelets: 212 10*3/uL (ref 150–440)
RBC: 3.8 MIL/uL (ref 3.80–5.20)
RDW: 14 % (ref 11.5–14.5)
WBC: 13.8 10*3/uL — ABNORMAL HIGH (ref 3.6–11.0)

## 2014-10-14 LAB — GLUCOSE, CAPILLARY: Glucose-Capillary: 122 mg/dL — ABNORMAL HIGH (ref 65–99)

## 2014-10-14 MED ORDER — SODIUM CHLORIDE 0.9 % IV SOLN
Freq: Once | INTRAVENOUS | Status: AC
  Administered 2014-10-14: 15:00:00 via INTRAVENOUS

## 2014-10-14 MED ORDER — FUROSEMIDE 10 MG/ML IJ SOLN: 20.0000 mg | Freq: Two times a day (BID) | INTRAMUSCULAR | Status: DC

## 2014-10-14 MED ORDER — DEXTROSE 5 % IV SOLN
0.0000 ug/min | INTRAVENOUS | Status: DC
  Administered 2014-10-14: 5 ug/min via INTRAVENOUS
  Administered 2014-10-15: 30 ug/min via INTRAVENOUS
  Filled 2014-10-14 (×3): qty 1

## 2014-10-14 MED ORDER — MAGNESIUM SULFATE IN D5W 10-5 MG/ML-% IV SOLN
1.0000 g | Freq: Once | INTRAVENOUS | Status: AC
  Administered 2014-10-14: 1 g via INTRAVENOUS
  Filled 2014-10-14: qty 100

## 2014-10-14 NOTE — Progress Notes (Addendum)
[place] [facility] [place] NAME: Tiffany Gamble    MR#:  [id number]  DATE OF BIRTH:  [birth year]  SUBJECTIVE:  CHIEF COMPLAINT: Patient is resting comfortably with no pain. Her blood pressure is on the lower side today but patient denies any dizziness. Patient's sister is at bedside . Patient's therapeutic paracentesis was incompletely done as patient was hypotensive. They were able to extract only 200 cc of fluid. Marland Kitchen  REVIEW OF SYSTEMS:  CONSTITUTIONAL: No fever, fatigue or weakness.  EYES: No blurred or double vision.  EARS, NOSE, AND THROAT: No tinnitus or ear pain.  RESPIRATORY: No cough, shortness of breath, wheezing or hemoptysis.  CARDIOVASCULAR: No chest pain, orthopnea, edema.  GASTROINTESTINAL: No nausea, vomiting, diarrhea or abdominal pain.  GENITOURINARY: No dysuria, hematuria.  ENDOCRINE: No polyuria, nocturia,  HEMATOLOGY: No anemia, easy bruising or bleeding SKIN: No rash or lesion. MUSCULOSKELETAL: No joint pain or arthritis.   NEUROLOGIC: No tingling, numbness, weakness.  PSYCHIATRY: No anxiety or depression.   DRUG ALLERGIES:   Allergies  Allergen Reactions  . Acetaminophen Other (See Comments)    Reaction: Affects liver functioning  . Clotrimazole Other (See Comments)    Reaction: Unknown  . Codeine Hives and Other (See Comments)    Reaction: [place]  . Fluticasone Furoate-Vilanterol Other (See Comments)    Reaction: Thrush  . Hydrocodone-Acetaminophen Itching and Other (See Comments)    Reaction: Weal  . Ibuprofen Other (See Comments)    Reaction: Affects liver functioning  . Prednisone Swelling and Other (See Comments)    Reaction: Throat swelling  . Penicillin G Rash  . Penicillins Rash    VITALS:  Blood pressure 82/56, pulse 104, temperature 97.5 F (36.4 C), temperature source Oral, resp. rate 18, height 5\' 1"  (1.549 m), weight 102.377 kg (225 lb 11.2 oz), SpO2 95 %.  PHYSICAL EXAMINATION:  GENERAL:   69 y.o.-year-old patient lying in the bed with no acute distress. Looks edematous EYES: Pupils equal, round, reactive to light and accommodation. No scleral icterus. Extraocular muscles intact.  HEENT: Head atraumatic, normocephalic. Oropharynx and nasopharynx clear.  NECK:  Supple, no jugular venous distention. No thyroid enlargement, no tenderness.  LUNGS: Normal breath sounds bilaterally, no wheezing, rales,rhonchi or crepitation. No use of accessory muscles of respiration.  CARDIOVASCULAR: S1, S2 normal. No murmurs, rubs, or gallops.  ABDOMEN: Soft, distended. Bowel sounds present.  EXTREMITIES: 2-3+ pedal edema, cyanosis, or clubbing.  NEUROLOGIC: Cranial nerves II through XII are intact. Muscle strength 5/5 in all extremities. Sensation intact. Gait not checked.  PSYCHIATRIC: The patient is alert and oriented x 3.  SKIN: No obvious rash, lesion, or ulcer.    LABORATORY PANEL:   CBC  Recent Labs Lab 10/14/14 0414  WBC 13.8*  HGB 11.5*  HCT 33.7*  PLT 212   ------------------------------------------------------------------------------------------------------------------  Chemistries   Recent Labs Lab 10/13/14 1135 10/14/14 0414  NA 130* 130*  K 2.2* 3.1*  CL 98* 99*  CO2 20* 21*  GLUCOSE 125* 126*  BUN 13 16  CREATININE 0.94 1.14*  CALCIUM 7.5* 7.5*  MG 1.4*  --   AST 37 24  ALT 24 21  ALKPHOS 236* 220*  BILITOT 3.7* 3.5*   ------------------------------------------------------------------------------------------------------------------  Cardiac Enzymes  Recent Labs Lab 10/13/14 1135  TROPONINI <0.03   ------------------------------------------------------------------------------------------------------------------  RADIOLOGY:  Dg Chest 2 View  10/13/2014   CLINICAL DATA:  Shortness of breath. History of pancreatic carcinoma.  EXAM: CHEST - 2 VIEW  COMPARISON:  07/22/2014  FINDINGS: Stable positioning of Port-A-Cath. Bibasilar atelectasis present, right  greater than left. Mild interstitial prominence present in both lungs which appears slightly more prominent. This may be consistent with minimal interstitial edema and is not associated with alveolar edema or pleural fluid. The heart size and mediastinal contours are normal. No bony abnormalities are seen.  IMPRESSION: Mild increased can bilateral pulmonary interstitial prominence. This may be consistent with mild interstitial edema.   Electronically Signed   By: Aletta Edouard M.D.   On: 10/13/2014 11:39   US Paracentesis  10/14/2014   CLINICAL DATA:  Ascites  EXAM: ULTRASOUND GUIDED PARACENTESIS  TECHNIQUE: Survey ultrasound of the abdomen was performed and an appropriate skin entry site in the right lower abdomen was selected. Skin site was marked, prepped with Betadine, and draped in usual sterile fashion, and infiltrated locally with 1% lidocaine. A Safe-T-Centesis needle was advanced into the peritoneal space until fluid could be aspirated. The sheath was advanced and the needle removed. 200 cc of clear yellowascites were aspirated. The initial systolic blood pressure was 80/6 mm Hg. After aspirating 200 cc, the blood pressure dropped to 77 mm Hg and the patient was slightly lightheaded. After placing the patient supine, she recovered clinically.  COMPLICATIONS: None  IMPRESSION: Technically successful ultrasound guided paracentesis, removing 200 cc of ascites.   Electronically Signed   By: Marybelle Killings M.D.   On: 10/14/2014 11:27    EKG:   Orders placed or performed during the hospital encounter of 10/13/14  . ED EKG  . ED EKG  . EKG 12-Lead  . EKG 12-Lead    ASSESSMENT AND PLAN:    1. Shortness of breath and swelling of her lower extremities/anasarca: Due to her pancreatic cancer, low albumen state possible diastolic congestive heart failure. Will give Lasix cautiously as the patient is hypotensive at this time.  2. Atrial fibrillation: New onset, patient not a candidate for  anticoagulation, discontinued Cardizem secondary to hypotension . Cardiology consult is placed to Dr. Nehemiah Massed. Currently rate controlled. check a TSH. Replace her electrolytes  3 . Severe hypokalemia: Replace her potassium orally and IV when necessary  4. COPD: Continue nebulizers, Symbicort and Spiriva as doing at home  5. Pancreatic cancer: Likely progressive, palliative care consult to address CODE STATUS continue pain control Currently patient is DO NOT RESUSCITATE and family is considering comfort care at this time Patient is scheduled to get biliary stent placement tomorrow. she will be nothing by mouth after midnight today  6. Nicotine addiction smoking cessation provided to the patient for minutes spent nicotine replacement will be offered strongly recommended she stop smoking.,   7. Hypotension-holding blood pressure medications. Morphine is discontinued. Will continue pain management with fentanyl patch and oxycodone. Gentle hydration with 200 cc fluids. Pan cultures are ordered  If necessary patient will be transferred to stepdown unit.  Patient's systolic blood pressure is still at around 80s after fluid bolus. Patient is agreeable to the transfer to intensive care unit. Will transfer the patient to stepdown unit     All the records are reviewed and case discussed with Care Management/Social Workerr. Management plans discussed with the patient, family and they are in agreement.  CODE STATUS: DO NOT RESUSCITATE   TOTAL CRITICAL CARE TIME TAKING CARE OF THIS PATIENT:  40 minutes.   POSSIBLE D/C IN 2-3 DAYS, DEPENDING ON CLINICAL CONDITION.   Nicholes Mango M.D on 10/14/2014 at 3:43 PM  Between 7am to 6pm - Pager - [phone] After  6pm go to [email or web address] - password [email or web address] [facility]  Office  [phone]  CC: Primary care physician; Enid Derry, MD

## 2014-10-14 NOTE — Procedures (Signed)
RLQ para 200 cc No comp/EBL

## 2014-10-14 NOTE — Progress Notes (Signed)
Dr. Margaretmary Eddy notified of patient's hypotension. MD ordered to run a 223mL bag of fluids at 175mL/hr and to transfer patient to ICU. Will starts fluids and notify charge nurse for bed request.

## 2014-10-14 NOTE — Progress Notes (Signed)
Initial Nutrition Assessment  DOCUMENTATION CODES:     INTERVENTION:   (Nutrition diet education: ) Discussed low sodium diet option with pt and husband and assisted with explaining menu.   NUTRITION DIAGNOSIS:   (None at this time) related to   as evidenced by  .    GOAL:  Patient will meet greater than or equal to 90% of their needs    MONITOR:   (Energy intake, pulmonary profile, Electrolyte and renal profile)  REASON FOR ASSESSMENT:  Other (Comment) (diagnosis)    ASSESSMENT:  Pt admitted with ascites, shortness of breath  Past Medical History  Diagnosis Date  . Lupus   . Staph infection     MRSA  . Arthritis   . Cancer 2016    pancreas  . Ovarian cancer 2011    left  . Pancreatic cancer   . COPD (chronic obstructive pulmonary disease)    Current intake: Ate 100% of lunch today, tray observed.  Does not like low sodium diet. Noted per I and O sheet intake 100% of meals Intake prior to admission: Pt reports good appetite prior to admission, husband confirms  Labs: Electrolyte and Renal Profile:  Recent Labs Lab 10/13/14 1135 10/14/14 0414  BUN 13 16  CREATININE 0.94 1.14*  NA 130* 130*  K 2.2* 3.1*  MG 1.4*  --    Medications: lasix, MVI, nystatin   Height:  Ht Readings from Last 1 Encounters:  10/13/14 5\' 1"  (1.549 m)    Weight:  Wt Readings from Last 1 Encounters:  10/14/14 225 lb 11.2 oz (102.377 kg)    Weight change: noted weight increase (fluid)     Wt Readings from Last 10 Encounters:  10/14/14 225 lb 11.2 oz (102.377 kg)  09/30/14 201 lb 8 oz (91.4 kg)  09/17/14 181 lb 10.5 oz (82.4 kg)  09/10/14 192 lb 14.4 oz (87.5 kg)  07/30/14 190 lb 0.6 oz (86.2 kg)  07/17/14 191 lb (86.637 kg)    BMI:  Body mass index is 42.67 kg/(m^2).     Skin:  Reviewed, no issues  Diet Order:  Diet 2 gram sodium Room service appropriate?: Yes; Fluid consistency:: Thin Diet NPO time specified  EDUCATION NEEDS:  No education needs  identified at this time   Intake/Output Summary (Last 24 hours) at 10/14/14 1412 Last data filed at 10/14/14 1115  Gross per 24 hour  Intake    483 ml  Output    870 ml  Net   -387 ml     LOW Care Level Tiffany Gamble Tiffany Gamble, Tiffany Gamble, Tiffany Gamble (pager)

## 2014-10-14 NOTE — Plan of Care (Signed)
Problem: Consults Goal: General Medical Patient Education See Patient Education Module for specific education.  Outcome: Not Met (add Reason) Unrelieved pain and SOB

## 2014-10-14 NOTE — Progress Notes (Signed)
Notified Dr. Margaretmary Eddy that patient is back and her blood pressure is low. Paracentesis was not able to be completed due to hypotension. MD is aware and will go in to see the patient.

## 2014-10-14 NOTE — Progress Notes (Signed)
Report called to [facility] in ICU. 250cc finished infusing. Will call orderly to transport patient. Belongings gathered and medications tubed to ICU. Patient transporting to [place].

## 2014-10-14 NOTE — Consult Note (Signed)
[place]  [phone]) [phone] [phone]) [phone]  ID: Erik Obey OB: June 06, 1945  MR#: [id number]  DDU#:[id number]  Patient Care Team: [name], MD as PCP - General (Family Medicine) Robert Bellow, MD (General Surgery) Lloyd Huger, MD as Consulting Physician (Oncology) Clent Jacks, RN as Registered Nurse  CHIEF COMPLAINT:  Chief Complaint  Patient presents with  . Shortness of Breath    x 2 days    INTERVAL HISTORY: Patient is a 69 year old female with a history of stage IV pancreatic cancer who was recently readmitted for reaccumulation of her ascites and increasing shortness of breath. Ultrasound guided paracentesis was only able to remove 200 mL prior to patient becoming hypotensive. Patient feels improved since admission, but still uncomfortable with her abdominal distention. Her pain is better controlled on her current narcotic regimen. Patient feels generally terrible, but offers no further complaints.  REVIEW OF SYSTEMS:   Review of Systems  Constitutional: Positive for malaise/fatigue.  Respiratory: Positive for shortness of breath.   Cardiovascular: Negative for chest pain.  Gastrointestinal: Positive for nausea and abdominal pain.  Neurological: Positive for weakness.    As per HPI. Otherwise, a complete review of systems is negatve.  PAST MEDICAL HISTORY: Past Medical History  Diagnosis Date  . Lupus   . Staph infection     MRSA  . Arthritis   . Cancer 2016    pancreas  . Ovarian cancer 2011    left  . Pancreatic cancer   . COPD (chronic obstructive pulmonary disease)     PAST SURGICAL HISTORY: Past Surgical History  Procedure Laterality Date  . Tonsillectomy    . Appendectomy    . Hand surgery Bilateral   . Knee surgery Right   . Partial hysterectomy    . Tubal ligation    . Ventral hernia repair  03-17-10, 11-24-10    Dr Bary Castilla  . Umbilical hernia repair  02-09-11    and ventral by Dr Bary Castilla    . Laparoscopic lysis intestinal adhesions  03-09-10    Dr Bary Castilla  . Incision and drainage  09-17-10    with wound vac Dr Bary Castilla  . Abd wall hernia  09-07-10     Dr. Bary Castilla  . Hernia repair  2014    last surgery was Dr. Duke Salvia  . Gastric bypass    . Ercp N/A 09/29/2014    Procedure: ENDOSCOPIC RETROGRADE CHOLANGIOPANCREATOGRAPHY (ERCP);  Surgeon: Lucilla Lame, MD;  Location: [facility] ENDOSCOPY;  Service: Endoscopy;  Laterality: N/A;    FAMILY HISTORY Family History  Problem Relation Age of Onset  . Cancer Mother     lymphoma  . Heart attack Father        ADVANCED DIRECTIVES:    HEALTH MAINTENANCE: History  Substance Use Topics  . Smoking status: Current Every Day Smoker -- 0.00 packs/day for 50 years  . Smokeless tobacco: Never Used  . Alcohol Use: No     Colonoscopy:  PAP:  Bone density:  Lipid panel:  Allergies  Allergen Reactions  . Acetaminophen Other (See Comments)    Reaction: Affects liver functioning  . Clotrimazole Other (See Comments)    Reaction: Unknown  . Codeine Hives and Other (See Comments)    Reaction: Weal  . Fluticasone Furoate-Vilanterol Other (See Comments)    Reaction: Thrush  . Hydrocodone-Acetaminophen Itching and Other (See Comments)    Reaction: Weal  . Ibuprofen Other (See Comments)    Reaction: Affects liver functioning  . Prednisone  Swelling and Other (See Comments)    Reaction: Throat swelling  . Penicillin G Rash  . Penicillins Rash    Current Facility-Administered Medications  Medication Dose Route Frequency Provider Last Rate Last Dose  . albuterol (PROVENTIL) (2.5 MG/3ML) 0.083% nebulizer solution 2.5 mg  2.5 mg Nebulization Q6H PRN Dustin Flock, MD   2.5 mg at 10/14/14 1330  . budesonide-formoterol (SYMBICORT) 160-4.5 MCG/ACT inhaler 2 puff  2 puff Inhalation BID Dustin Flock, MD   2 puff at 10/14/14 0934  . cyclobenzaprine (FLEXERIL) tablet 10 mg  10 mg Oral BID Dustin Flock, MD   10 mg at 10/14/14 0826  . diltiazem  (CARDIZEM) injection 10 mg  10 mg Intravenous Q6H PRN Dustin Flock, MD      . fentaNYL ([place] - dosed mcg/hr) patch 75 mcg  75 mcg Transdermal Q72H Dustin Flock, MD   75 mcg at 10/13/14 1605  . fluticasone (FLONASE) 50 MCG/ACT nasal spray 2 spray  2 spray Each Nare Daily Dustin Flock, MD   2 spray at 10/14/14 0926  . furosemide (LASIX) injection 20 mg  20 mg Intravenous BID Nicholes Mango, MD   20 mg at 10/14/14 1900  . magnesium sulfate IVPB 1 g 100 mL  1 g Intravenous Once Nicholes Mango, MD   1 g at 10/14/14 1859  . multivitamin with minerals tablet 1 tablet  1 tablet Oral Daily Dustin Flock, MD   1 tablet at 10/14/14 2297  . nicotine polacrilex (NICORETTE) gum 2 mg  2 mg Oral PRN Dustin Flock, MD      . nystatin (MYCOSTATIN) 100000 UNIT/ML suspension 500,000 Units  5 mL Oral QID Dustin Flock, MD   500,000 Units at 10/14/14 1915  . oxyCODONE (Oxy IR/ROXICODONE) immediate release tablet 20 mg  20 mg Oral QID PRN Dustin Flock, MD   20 mg at 10/14/14 1914  . pantoprazole (PROTONIX) EC tablet 40 mg  40 mg Oral BID Dustin Flock, MD   40 mg at 10/14/14 0925  . phenylephrine (NEO-SYNEPHRINE) 10 mg in dextrose 5 % 250 mL (0.04 mg/mL) infusion  0-400 mcg/min Intravenous Titrated Nicholes Mango, MD   0 mcg/min at 10/14/14 1806  . polyethylene glycol (MIRALAX / GLYCOLAX) packet 17 g  17 g Oral BID PRN Dustin Flock, MD      . potassium chloride (KLOR-CON) packet 40 mEq  40 mEq Oral BID Dustin Flock, MD   40 mEq at 10/14/14 0925  . prochlorperazine (COMPAZINE) tablet 10 mg  10 mg Oral Q8H PRN Dustin Flock, MD      . sodium chloride 0.9 % injection 3 mL  3 mL Intravenous Q12H Dustin Flock, MD   3 mL at 10/14/14 0927  . tiotropium (SPIRIVA) inhalation capsule 18 mcg  18 mcg Inhalation Daily Dustin Flock, MD   18 mcg at 10/14/14 0826  . ursodiol (ACTIGALL) capsule 300 mg  300 mg Oral QID Dustin Flock, MD   300 mg at 10/14/14 1412   Facility-Administered Medications Ordered in Other  Encounters  Medication Dose Route Frequency Provider Last Rate Last Dose  . sodium chloride 0.9 % injection 10 mL  10 mL Intravenous PRN Lloyd Huger, MD   10 mL at 09/10/14 1030    OBJECTIVE: Filed Vitals:   10/14/14 1800  BP: 82/62  Pulse: 104  Temp:   Resp: 16     Body mass index is 42.67 kg/(m^2).    ECOG FS:3 - Symptomatic, >50% confined to bed  General: Ill-appearing, no acute  distress. Eyes: anicteric sclera. HEENT: Normocephalic, moist mucous membranes, clear oropharnyx. Lungs: Clear to auscultation bilaterally. Heart: Regular rate and rhythm. No rubs, murmurs, or gallops. Abdomen: Distended, diffuse mild tenderness. Drains noted in place. Musculoskeletal: 2-3+ lower extremity edema.  Neuro: Alert, answering all questions appropriately. Cranial nerves grossly intact. Skin: No rash or petechiae noted. Psych: Normal affect.   LAB RESULTS:  Lab Results  Component Value Date   NA 130* 10/14/2014   K 3.1* 10/14/2014   CL 99* 10/14/2014   CO2 21* 10/14/2014   GLUCOSE 126* 10/14/2014   BUN 16 10/14/2014   CREATININE 1.14* 10/14/2014   CALCIUM 7.5* 10/14/2014   PROT 5.0* 10/14/2014   ALBUMIN 1.9* 10/14/2014   AST 24 10/14/2014   ALT 21 10/14/2014   ALKPHOS 220* 10/14/2014   BILITOT 3.5* 10/14/2014   GFRNONAA 49* 10/14/2014   GFRAA 56* 10/14/2014    Lab Results  Component Value Date   WBC 13.8* 10/14/2014   NEUTROABS 14.3* 10/13/2014   HGB 11.5* 10/14/2014   HCT 33.7* 10/14/2014   MCV 88.5 10/14/2014   PLT 212 10/14/2014     STUDIES: Dg Chest 1 View  10/14/2014   CLINICAL DATA:  PICC line placement.  EXAM: CHEST  1 VIEW  COMPARISON:  10/13/2014  FINDINGS: A right-sided Port-A-Cath terminates over the low SVC or cavoatrial junction. Placement of a right-sided PICC line which terminates at the mid to low SVC.  Midline trachea. Normal heart size. No pleural effusion or pneumothorax. Mildly low lung volumes with minimal left base subsegmental atelectasis.   IMPRESSION: Right sided PICC line, terminating at the mid to low SVC, without pneumothorax.   Electronically Signed   By: Abigail Miyamoto M.D.   On: 10/14/2014 18:44   Dg Chest 2 View  10/13/2014   CLINICAL DATA:  Shortness of breath. History of pancreatic carcinoma.  EXAM: CHEST - 2 VIEW  COMPARISON:  07/22/2014  FINDINGS: Stable positioning of Port-A-Cath. Bibasilar atelectasis present, right greater than left. Mild interstitial prominence present in both lungs which appears slightly more prominent. This may be consistent with minimal interstitial edema and is not associated with alveolar edema or pleural fluid. The heart size and mediastinal contours are normal. No bony abnormalities are seen.  IMPRESSION: Mild increased can bilateral pulmonary interstitial prominence. This may be consistent with mild interstitial edema.   Electronically Signed   By: Aletta Edouard M.D.   On: 10/13/2014 11:39   Ct Head Wo Contrast  09/28/2014   CLINICAL DATA:  LEFT facial droop for 1 day, syncope. History of pancreatic cancer, on chemotherapy. History of lupus and ovarian cancer.  EXAM: CT HEAD WITHOUT CONTRAST  TECHNIQUE: Contiguous axial images were obtained from the base of the skull through the vertex without intravenous contrast.  COMPARISON:  None.  FINDINGS: The ventricles and sulci are normal for age. No intraparenchymal hemorrhage, mass effect nor midline shift. Patchy supratentorial white matter hypodensities are less than expected patient's age and though non-specific suggest sequelae of chronic small vessel ischemic disease. No acute large vascular territory infarcts.  No abnormal extra-axial fluid collections. Basal cisterns are patent. Mild calcific atherosclerosis of the included vertebral arteries.  No skull fracture. The included ocular globes and orbital contents are non-suspicious. Bilateral ocular lens implants. The mastoid aircells and included paranasal sinuses are well-aerated. Patient is edentulous.   IMPRESSION: No acute intracranial process ; normal noncontrast CT of the head for age.   Electronically Signed   By: Elon Alas  On: 09/28/2014 18:25   Ct Abdomen Pelvis W Contrast  09/16/2014   CLINICAL DATA:  Restaging pancreatic cancer  EXAM: CT ABDOMEN AND PELVIS WITH CONTRAST  TECHNIQUE: Multidetector CT imaging of the abdomen and pelvis was performed using the standard protocol following bolus administration of intravenous contrast.  CONTRAST:  100 mL Omnipaque 300 IV  COMPARISON:  06/10/2014  FINDINGS: Lower chest:  Mild scarring in the lingula/left lower lobe.  Hepatobiliary: Cirrhotic configuration of the liver. No suspicious/enhancing hepatic lesions.  Status post cholecystectomy. Moderate intrahepatic and extrahepatic ductal dilatation, increased.  Pancreas: 3.8 x 2.8 cm hypoenhancing mass along the medial aspect of the pancreatic head/uncinate process (series 2/ image 35), involving/encasing the SMA and SMV, previously 3.7 x 3.1 cm. Associated dilatation of the main pancreatic duct.  Spleen: Splenomegaly, measuring 19.1 cm.  Adrenals/Urinary Tract: Adrenal glands are within normal limits.  Kidneys within normal limits.  No hydronephrosis.  Bladder is within normal limits.  Stomach/Bowel: Prior gastric surgery with gastrojejunostomy.  No evidence of bowel obstruction.  Vascular/Lymphatic: Atherosclerotic calcifications of the abdominal aorta and branch vessels.  Portal vein is patent. Tumor involves the undersurface of the portal vein/portosplenic confluence (coronal image 73). Tumor encases the proximal aspect of the SMA (series 2/image 33).  Upper abdominal/retroperitoneal lymphadenopathy, including:  --12 mm short axis right juxta diaphragmatic node (series 2/image 7), previously 13 mm  --15 mm short axis gastrohepatic node (series 2/ image 22), previously 16 mm  --12 mm short axis aortocaval node (series 2/ image 35), previously 17 mm  Reproductive: Status post hysterectomy.  No adnexal  masses.  Other: Moderate abdominopelvic ascites.  Associated mild peritoneal thickening/nodularity in the pelvis (series 2/ image 72), worrisome for peritoneal disease.  Postsurgical changes related to prior ventral hernia mesh repair.  Musculoskeletal: Degenerative changes of the visualized thoracolumbar spine.  IMPRESSION: 3.8 cm hypoenhancing mass along the medial aspect of the pancreatic head/uncinate process, corresponding to known primary pancreatic neoplasm, grossly unchanged.  Associated vascular involvement, as described above.  Upper abdominal lymphadenopathy, mildly decreased.  Moderate abdominal ascites with associated peritoneal thickening/ nodularity, worrisome for peritoneal disease.  Additional ancillary findings as above.   Electronically Signed   By: Julian Hy M.D.   On: 09/16/2014 12:49   Ir Biliary Dilitation  10/03/2014   CLINICAL DATA:  Pancreatic adenocarcinoma with distal CBD obstruction and jaundice, status post right external biliary drain only  EXAM: FLUOROSCOPIC CONVERSION OF THE EXISTING 8 FR BILIARY DRAIN TO AN INTERNAL EXTERNAL 10 FRENCH BILIARY DRAIN (across the distal CBD obstruction)  Date:  5/27/20165/27/2016 1:28 pm  Radiologist:  Jerilynn Mages. Daryll Brod, MD  Guidance:  A fluoroscopic  FLUOROSCOPY TIME:  2.8 minutes  MEDICATIONS AND MEDICAL HISTORY: 400 mg Cipro administered within 1 hour of the procedure, 75 mcg fentanyl, 2.5 mg Versed  ANESTHESIA/SEDATION: 20 minutes  CONTRAST:  10 cc Omnipaque 976  COMPLICATIONS: None immediate  PROCEDURE: Informed consent was obtained from the patient following explanation of the procedure, risks, benefits and alternatives. The patient understands, agrees and consents for the procedure. All questions were addressed. A time out was performed.  Maximal barrier sterile technique utilized including caps, mask, sterile gowns, sterile gloves, large sterile drape, hand hygiene, and ChloraPrep.  Under sterile conditions and local anesthesia, the  existing 8 French right external only biliary drain was injected with contrast. This confirms position in the common hepatic duct inferior to the biliary confluence. Persistent distal CBD obstruction again evident. This catheter was cut and removed over a Bentson  guidewire. Seven French sheath inserted. Sheath cholangiogram again confirms distal CBD obstruction. Five [place] Kumpe the catheter and a Glidewire were manipulated through the distal CBD obstruction into the duodenum. Contrast injection confirms position in the duodenum. Amplatz guidewire exchange performed. Over the guidewire, a new 37 [place] internal external biliary drain was advanced with the retention loop formed in the duodenum. Contrast injection confirms access across the distal CBD obstruction and adequate biliary drainage. Biliary system decompressed by syringe aspiration. Images obtained for documentation. Catheter secured with a Prolene suture and connected to external gravity drainage bag.  IMPRESSION: Successful conversion of the external only biliary drain to a 10 [place] internal external biliary drain across the distal CBD obstruction.  PLAN: Patient will return as an outpatient next Thursday 10/09/2014 for insertion of a Wallflex stent(covered endoscopically retrievable stent) and removal of the drain catheter.   Electronically Signed   By: Jerilynn Mages.  Shick M.D.   On: 10/03/2014 14:14   US Paracentesis  10/14/2014   CLINICAL DATA:  Ascites  EXAM: ULTRASOUND GUIDED PARACENTESIS  TECHNIQUE: Survey ultrasound of the abdomen was performed and an appropriate skin entry site in the right lower abdomen was selected. Skin site was marked, prepped with Betadine, and draped in usual sterile fashion, and infiltrated locally with 1% lidocaine. A Safe-T-Centesis needle was advanced into the peritoneal space until fluid could be aspirated. The sheath was advanced and the needle removed. 200 cc of clear yellowascites were aspirated. The initial systolic blood  pressure was 80/6 mm Hg. After aspirating 200 cc, the blood pressure dropped to 77 mm Hg and the patient was slightly lightheaded. After placing the patient supine, she recovered clinically.  COMPLICATIONS: None  IMPRESSION: Technically successful ultrasound guided paracentesis, removing 200 cc of ascites.   Electronically Signed   By: Marybelle Killings M.D.   On: 10/14/2014 11:27   US Paracentesis  09/30/2014   CLINICAL DATA:  69 year old female with a history of pancreatic carcinoma, now with biliary obstruction secondary to pancreatic head mass.  A percutaneous transhepatic cholangiogram with drainage is indicated given her hyperbilirubinemia, and the ascites increases risk factors for percutaneous access.  She has been referred for image guided therapeutic, diagnostic, and preprocedure drainage.  EXAM: ULTRASOUND GUIDED  PARACENTESIS  COMPARISON:  None.  PROCEDURE: An ultrasound guided paracentesis was thoroughly discussed with the patient and questions answered. The benefits, risks, alternatives and complications were also discussed. The patient understands and wishes to proceed with the procedure. Written consent was obtained.  Ultrasound was performed to localize and mark an adequate pocket of fluid in the left lower quadrant of the abdomen. The area was then prepped and draped in the normal sterile fashion. 1% Lidocaine was used for local anesthesia. Under ultrasound guidance a Safe-T-Centesis catheter was introduced. Paracentesis was performed.  After removal of fluid, the Safe-T-Centesis catheter was maintained, for intermittent drainage over night. This will be to evacuate as much fluid as possible from the abdomen given her pending percutaneous transhepatic cholangiogram and drainage.  The patient tolerated the procedure well and remained hemodynamically stable throughout.  No complications were encountered and no significant blood loss was encountered.  COMPLICATIONS: None.  FINDINGS: A total of  approximately 2.4 L of amber ascitic fluid was removed. A fluid sample was sent for laboratory analysis.  IMPRESSION: Status post placement of Safe-T-Centesis catheter into the left lower quadrant for drainage of the abdomen. Fluid was sent to the lab for complete analysis.  The catheter will remain overnight pending the patient's  upcoming percutaneous transhepatic cholangiogram.  Signed,  Dulcy Fanny. Earleen Newport DO  [facility]  [facility]  PLAN: The drainage catheter will remain overnight.  Intermittent drainage to evacuate as much fluid as possible from the peritoneal space.  The catheter may be removed after the percutaneous transhepatic cholangiogram on the following day.   Electronically Signed   By: Corrie Mckusick D.O.   On: 09/30/2014 13:47   Ir Percutaneous Transhepatic Cholangiogram  10/01/2014   Inez Catalina, MD     10/01/2014 11:13 AM Cholangiogram with 73F drain   Complications:  None  Blood Loss: none  See dictation in canopy pacs   Ir Biliary Drain Placement With Cholangiogram  10/01/2014   CLINICAL DATA:  Known pancreatic carcinoma with common bile duct obstruction  EXAM: IR BILIARY DRAIN PLACEMENT W/ CHOLANGIOGRAM  MEDICATIONS: 2 mg IV Versed , 100 mcg IV Fentanyl, 20 mL 1% lidocaine. The sedation time was 1 hour  CONTRAST:  36mL OMNIPAQUE IOHEXOL 300 MG/ML  SOLN  FLUOROSCOPY TIME:  Radiation Exposure Index (as provided by the fluoroscopic device): Not available  If the device does not provide the exposure index:  Fluoroscopy Time (in minutes and seconds):  14 minutes 30 seconds  Number of Acquired Images:  5  PROCEDURE: The patient was advised of the possible risks and complications and agreed to undergo the procedure. Following an appropriate time-out the patient was prepped and draped in the standard sterile manner along the right mid axillary line. 1% lidocaine was utilized for local anesthesia.  Utilizing fluoroscopic guidance, a 20 gauge diamond tip  needle was placed into the right lobe of the liver. The approximately 6 passes were made prior to gaining access to a right hepatic biliary duct. Contrast was injected and within a wire was placed and coursed ports the central common hepatic duct. The tract was then dilated with a 6 [place] Accustick dilator however upon removing the central portion of the dilator it was found that the wire had passed through the biliary duct into the portal vein. The dilator was then withdrawn to the margin of the liver and Gel-Foam slurry was utilized to include the initial puncture tract.  Repeat puncture into the right lobe of the liver was then performed twice with subsequent access into a right hepatic biliary duct. The guidewire was passed into the central ductal system and over this the Accustick dilator was placed. Small amount of contrast was then injected to confirm positioning within the common hepatic duct. The patient was experiencing some nausea and for this reason aggressive attempts to cross the obstructing lesion were not performed. A J wire was then coiled within the common bile duct and an 8 [place] multipurpose drain was then placed within the common bile duct. The ductal system was then decompressed and a small amount of contrast was injected to confirm catheter position. The catheter was then affixed to the skin surface with 0-Prolene and placed to an external drainage bag. The patient tolerated the procedure well and was returned to her room in satisfactory condition. The previously placed Safe-T-Centesis catheter was removed without difficulty.  Complications: Inadvertent passage of the guidewire into the portal vein which was remedied with embolization of the tract with Gel-Foam slurry.  FINDINGS: There is mild intrahepatic biliary ductal dilatation with marked dilatation of the common hepatic duct and common bile duct. Distal obstructive lesion is noted consistent with the patient's given clinical history of  pancreatic carcinoma. Due to some nausea and discomfort  aggressive attempts to cross this obstruction were not performed. This will likely be attempted in 48 hours.  IMPRESSION: Successful placement of an 6 [place] biliary drain within the common bile duct with a external drainage.  The patient will return in the short-term for a more aggressive attempt at crossing the obstructing lesion with definitive stent placement.   Electronically Signed   By: Inez Catalina M.D.   On: 10/01/2014 11:47    ASSESSMENT: Progressive stage IV pancreatic cancer. Hyperbilirubinemia.  PLAN:    1. Pancreatic cancer: Patient's symptoms likely related to progression of disease.  Patient was informed that chemotherapy is currently not an option given her current condition but if her performance status improved she could reconsider in the future. Hospice was discussed, but patient is not ready to pursue this option at this time. 2. Pain: Appreciate palliative care input, continue current narcotic regimen.  3. Nausea: Continue Compazine as needed. 4. Hyperbilirubinemia: Improved.  External drain in place. Likely secondary to progressive disease.  5. Hypotension: Likely multifactorial, patient transferred to stepdown in the ICU earlier today.  Appreciate consult, will follow.   Pancreatic cancer   Staging form: Pancreas, AJCC 7th Edition     Clinical stage from 08/24/2014: Stage III (T4, N0, M0) - Signed by Lloyd Huger, MD on 08/24/2014   Lloyd Huger, MD   10/14/2014 7:46 PM

## 2014-10-14 NOTE — Progress Notes (Signed)
Patient c/o pain and her BP is 91/66 , DR. Hower notified since patient's is going to receive Oxycodone 20 mg po. MD ordered to go ahead and give it. Will continue to monitor.

## 2014-10-15 ENCOUNTER — Inpatient Hospital Stay: Payer: Medicare Other

## 2014-10-15 ENCOUNTER — Telehealth: Payer: Self-pay

## 2014-10-15 ENCOUNTER — Ambulatory Visit: Payer: Medicare Other | Admitting: Oncology

## 2014-10-15 ENCOUNTER — Inpatient Hospital Stay
Admission: RE | Admit: 2014-10-15 | Discharge: 2014-10-15 | Disposition: A | Discharge status: Home / Self Care | Payer: Medicare Other | Source: Ambulatory Visit | Attending: Gastroenterology | Admitting: Gastroenterology

## 2014-10-15 ENCOUNTER — Other Ambulatory Visit: Payer: Medicare Other

## 2014-10-15 LAB — CBC
HEMATOCRIT: 34.4 % — AB (ref 35.0–47.0)
Hemoglobin: 11.5 g/dL — ABNORMAL LOW (ref 12.0–16.0)
MCH: 29.6 pg (ref 26.0–34.0)
MCHC: 33.5 g/dL (ref 32.0–36.0)
MCV: 88.4 fL (ref 80.0–100.0)
PLATELETS: 232 10*3/uL (ref 150–440)
RBC: 3.9 MIL/uL (ref 3.80–5.20)
RDW: 14.1 % (ref 11.5–14.5)
WBC: 17.1 10*3/uL — ABNORMAL HIGH (ref 3.6–11.0)

## 2014-10-15 LAB — COMPREHENSIVE METABOLIC PANEL
ALBUMIN: 1.8 g/dL — AB (ref 3.5–5.0)
ALT: 22 U/L (ref 14–54)
ANION GAP: 11 (ref 5–15)
AST: 27 U/L (ref 15–41)
Alkaline Phosphatase: 227 U/L — ABNORMAL HIGH (ref 38–126)
BUN: 26 mg/dL — ABNORMAL HIGH (ref 6–20)
CO2: 20 mmol/L — ABNORMAL LOW (ref 22–32)
Calcium: 7.9 mg/dL — ABNORMAL LOW (ref 8.9–10.3)
Chloride: 94 mmol/L — ABNORMAL LOW (ref 101–111)
Creatinine, Ser: 1.56 mg/dL — ABNORMAL HIGH (ref 0.44–1.00)
GFR calc Af Amer: 39 mL/min — ABNORMAL LOW (ref >60)
GFR calc non Af Amer: 33 mL/min — ABNORMAL LOW (ref >60)
GLUCOSE: 107 mg/dL — AB (ref 65–99)
POTASSIUM: 4 mmol/L (ref 3.5–5.1)
SODIUM: 125 mmol/L — AB (ref 135–145)
Total Bilirubin: 3.4 mg/dL — ABNORMAL HIGH (ref 0.3–1.2)
Total Protein: 4.9 g/dL — ABNORMAL LOW (ref 6.5–8.1)

## 2014-10-15 LAB — PROTIME-INR
INR: 2.45
Prothrombin Time: 26.7 seconds — ABNORMAL HIGH (ref 11.4–15.0)

## 2014-10-15 LAB — MAGNESIUM: Magnesium: 1.7 mg/dL (ref 1.7–2.4)

## 2014-10-15 LAB — GLUCOSE, CAPILLARY: Glucose-Capillary: 104 mg/dL — ABNORMAL HIGH (ref 65–99)

## 2014-10-15 MED ORDER — FENTANYL 75 MCG/HR TD PT72: 75.0000 ug | MEDICATED_PATCH | TRANSDERMAL | Status: AC

## 2014-10-15 MED ORDER — VANCOMYCIN HCL IN DEXTROSE 1-5 GM/200ML-% IV SOLN
1000.0000 mg | INTRAVENOUS | Status: AC
  Filled 2014-10-15: qty 200

## 2014-10-15 MED ORDER — MORPHINE SULFATE 20 MG/5ML PO SOLN: ORAL | Status: DC

## 2014-10-15 MED ORDER — PROCHLORPERAZINE MALEATE 10 MG PO TABS: 10.0000 mg | ORAL_TABLET | Freq: Three times a day (TID) | ORAL | Status: AC | PRN

## 2014-10-15 MED ORDER — SODIUM CHLORIDE 0.9 % IV SOLN: Freq: Once | INTRAVENOUS | Status: DC

## 2014-10-15 NOTE — Care Management Note (Signed)
Case Management Note  Patient Details  Name: Tiffany Gamble MRN: [id number] Date of Birth: 1945/06/14  Subjective/Objective:    Case discussed with [name] with Palliative Care. Pt has decided that she wants to go home with hospice services. Prefers [facility]. Will need hospital bed and O2. Referral to [name], [facility] who will arrange home hospice services. Plan is discharge today by ambulance. Primary nurse, Tillie Rung updated.                Action/Plan:   Expected Discharge Date:                  Expected Discharge Plan:  [place] Referral:     Discharge planning Services  CM Consult  Post Acute Care Choice:  Hospice Choice offered to:  Patient  DME Arranged:    DME Agency:     HH Arranged:    [place] Agency:  [facility]  Status of Service:     Medicare Important Message Given:  Yes Date Medicare IM Given:  10/15/14 Medicare IM give by:  Orvan July Date Additional Medicare IM Given:    Additional Medicare Important Message give by:     If discussed at [place] of Stay Meetings, dates discussed:    Additional Comments:  Jolly Mango, RN 10/15/2014, 11:04 AM

## 2014-10-15 NOTE — Consult Note (Signed)
Palliative Medicine Inpatient Consult Follow Up Note   Name: Tiffany Gamble Date: 10/15/2014 MRN: [id number]  DOB: [birth year]  Referring Physician: Nicholes Mango, MD  Palliative Care consult requested for this 69 y.o. female for goals of medical therapy in patient with pancreatic cancer, recurrent ascites, COPD, HTN, and h/o lupus. Pt admitted for recurrent ascites.  Pt currently resting in bed and appears SOB.  Pt states she feels terrible and just wants to go home. Marland Kitchen    REVIEW OF SYSTEMS:  Pain: Mild Dyspnea:  Yes Nausea/Vomiting:  No Anxiety:   Yes Fatigue:   Yes All other systems were reviewed and found to be negative  CODE STATUS: DNR   PAST MEDICAL HISTORY: Past Medical History  Diagnosis Date  . Lupus   . Staph infection     MRSA  . Arthritis   . Cancer 2016    pancreas  . Ovarian cancer 2011    left  . Pancreatic cancer   . COPD (chronic obstructive pulmonary disease)     PAST SURGICAL HISTORY:  Past Surgical History  Procedure Laterality Date  . Tonsillectomy    . Appendectomy    . Hand surgery Bilateral   . Knee surgery Right   . Partial hysterectomy    . Tubal ligation    . Ventral hernia repair  03-17-10, 11-24-10    Dr Bary Castilla  . Umbilical hernia repair  02-09-11    and ventral by Dr Bary Castilla  . Laparoscopic lysis intestinal adhesions  03-09-10    Dr Bary Castilla  . Incision and drainage  09-17-10    with wound vac Dr Bary Castilla  . Abd wall hernia  09-07-10     Dr. Bary Castilla  . Hernia repair  2014    last surgery was Dr. Duke Salvia  . Gastric bypass    . Ercp N/A 09/29/2014    Procedure: ENDOSCOPIC RETROGRADE CHOLANGIOPANCREATOGRAPHY (ERCP);  Surgeon: Lucilla Lame, MD;  Location: [facility] ENDOSCOPY;  Service: Endoscopy;  Laterality: N/A;    Vital Signs: BP 82/60 mmHg  Pulse 106  Temp(Src) 98.1 F (36.7 C) (Oral)  Resp 16  Ht 5\' 1"  (1.549 m)  Wt 104.4 kg (230 lb 2.6 oz)  BMI 43.51 kg/m2  SpO2 97% Filed Weights   10/13/14 1500 10/14/14 0439 10/15/14 0556   Weight: 100.789 kg (222 lb 3.2 oz) 102.377 kg (225 lb 11.2 oz) 104.4 kg (230 lb 2.6 oz)    Estimated body mass index is 43.51 kg/(m^2) as calculated from the following:   Height as of this encounter: 5\' 1"  (1.549 m).   Weight as of this encounter: 104.4 kg (230 lb 2.6 oz).  PHYSICAL EXAM: General: ill appearing HEENT: OP clear, moist oral mucosa, sclera yellow Neck: Trachea midline  Cardiovascular: RRR Pulmonary/Chest: Clear ant fields Abdominal: Soft, NTTP, distended Hypoactive bowel sounds, drain noted to RUQ Extremities: No edema  Neurological: Grossly nonfocal Skin: Warm, dry and yellow Psychiatric: Calm, A&O x 3   LABS: CBC:    Component Value Date/Time   WBC 17.1* 10/15/2014 0518   WBC 1.5* 09/01/2014 1959   HGB 11.5* 10/15/2014 0518   HGB 9.5* 09/01/2014 1959   HCT 34.4* 10/15/2014 0518   HCT 28.5* 09/01/2014 1959   PLT 232 10/15/2014 0518   PLT 97* 09/01/2014 1959   MCV 88.4 10/15/2014 0518   MCV 91 09/01/2014 1959   NEUTROABS 14.3* 10/13/2014 1135   NEUTROABS 2.7 08/27/2014 0907   LYMPHSABS 1.0 10/13/2014 1135   LYMPHSABS 0.6* 08/27/2014 0932  MONOABS 1.0* 10/13/2014 1135   MONOABS 0.3 08/27/2014 0907   EOSABS 0.0 10/13/2014 1135   EOSABS 0.1 08/27/2014 0907   BASOSABS 0.0 10/13/2014 1135   BASOSABS 0.0 08/27/2014 0907   Comprehensive Metabolic Panel:    Component Value Date/Time   NA 125* 10/15/2014 0518   NA 132* 09/01/2014 1959   K 4.0 10/15/2014 0518   K 3.4* 09/01/2014 1959   CL 94* 10/15/2014 0518   CL 100* 09/01/2014 1959   CO2 20* 10/15/2014 0518   CO2 27 09/01/2014 1959   BUN 26* 10/15/2014 0518   BUN 8 09/01/2014 1959   CREATININE 1.56* 10/15/2014 0518   CREATININE 0.53 09/01/2014 1959   GLUCOSE 107* 10/15/2014 0518   GLUCOSE 107* 09/01/2014 1959   CALCIUM 7.9* 10/15/2014 0518   CALCIUM 8.0* 09/01/2014 1959   AST 27 10/15/2014 0518   AST 42* 09/01/2014 1959   ALT 22 10/15/2014 0518   ALT 35 09/01/2014 1959   ALKPHOS 227*  10/15/2014 0518   ALKPHOS 325* 09/01/2014 1959   BILITOT 3.4* 10/15/2014 0518   PROT 4.9* 10/15/2014 0518   PROT 5.6* 09/01/2014 1959   ALBUMIN 1.8* 10/15/2014 0518   ALBUMIN 2.3* 09/01/2014 1959    IMPRESSION: Tiffany Gamble is a 69 y.o. female with pancreatic cancer, recurrent ascites, COPD, HTN, h/o gastric bypass. and h/o lupus. Pt presented to ER from home due to increasing SOB. ER workup significant for hyperbilirubinemia, leukocytosis, hypokalemia and ascites. Pt admitted for evaluation and treatment. Family at bedside.   Pt currently in CCU after hypotension yesterday.  Pt is on pressors.  Paracentesis was not tolerated by pt yesterday.  Pt states today she wants to go home and be comfortable.  Pt understands she is approaching EOL and wants to be at home around family when this happens.  Pt states that if she could have a biliary stent placed she would deny it now.  Fiance at bedside and agrees to hospice at home as well.   CM and attending updated.  Please keep pt in ICU until discharge and on pressors if needed.  After d/c pt does not want rehospitalization.  PLAN: 1. Home with hospice for EOL care  REFERRALS TO BE ORDERED:  Hospice   More than 50% of the visit was spent in counseling/coordination of care: YES  Time Spent: 45 minutes

## 2014-10-15 NOTE — Consult Note (Signed)
Fiance phone number is [phone]

## 2014-10-15 NOTE — Progress Notes (Signed)
Mrs. Axe is alert and oriented. Remains on Neosynephrine until discharge. BP systolic in the 30'Z. Pt is going to discharge home with hospice. Pt fiance has called and said that hospital bed and home o2 has been delivered to their home. Called EMS. Awaiting arrival. Pt has refused to remove PICC line from right upper arm.

## 2014-10-15 NOTE — Progress Notes (Signed)
Tiffany Gamble just discharged home by EMS. Belongings given to pt, and biliary drain in place as ordered.

## 2014-10-15 NOTE — Progress Notes (Signed)
New referral for [facility] services after discharge. Tiffany Gamble is a 69 y.o. woman with pancreatic cancer, recurrent ascites, COPD, HTN, h/o gastric bypass. and h/o lupus. Patient came to the [facility] ER from home due to increasing SOB. ER workup was significant for hyperbilirubinemia, leukocytosis, hypokalemia and ascites, and was admitted for evaluation and treatment. Patient moved to the ICU on 6/7 from telemetry due to hypotension and was started on vasopressors. Per chart notes she was not able to tolerate a  paracentesis yesterday. Patient and her significant other Tiffany Gamble, remet with Palliative care and have chosen to return home with hospice services. Writer met with patient and her brother Tiffany Gamble and sister in Sports coach Tiffany Gamble at bedside. Patient alert and interactive, appears jaundice, is no longer able to ambulate. She also appeared dyspneic, oxygen in place at 3 liters nasal cannula. Writer initiated education regarding hospice services, philosophy of and team approach to care with good understanding voiced by all. Patient needs a hospital bed and oxygen, these need to be delivered prior to her discharge.She wants to go home TODAY.Writer contacted patient significant other Tiffany Gamble regarding services and equipment needs/delivery. Tiffany Gamble is to be the contact for equipment delivery as well as admission, address and contact number confirmed [phone]. Tiffany Gamble to stay at the home to accept DME delivery. Patient information faxed to referral intake. DME ordered to be delivered ASAP. Writer requested that Tiffany Gamble call the ICU to notify the staff RN when DME is delivered so that EMS can be notified for pick up. Staff RN Tillie Rung made aware.  Of note patient currently has a 75 mcg Fentanyl patch in place on her left arm(change due 6/9), she has a biliary drain to her right lower quadrant draining to a drainage bag and PICC line,( insertion date 6/7) to her upper right arm. She remains on vasopressors  for management of hypotension, these will be discontinued at discharge. CM, CSW, MD, patient and family all aware of plan for patient to discharge today via EMS with portable DNR in place. Thank you for the opportunity to serve [name] and her family. [name] RN, BSN, [name] [facility] [place],  [facility] [phone] c

## 2014-10-15 NOTE — Clinical Social Work Note (Signed)
Patient discharging today and is requesting EMS transport home. CSW has prepared EMS form and patient's nurse is aware.  Shela Leff MSW,LCSWA [phone]

## 2014-10-15 NOTE — Progress Notes (Signed)
Pt is a x o x3. C/o  Belly pain. PRN pain meds given as ordered.

## 2014-10-15 NOTE — Progress Notes (Signed)
MRS.Cory IS ALERT AND ORIENTED. REMAINS ON NEOSYNEPHRINE FOR HYPOTENSION. pHYSICIAN AWARE ON SODIUM OF 125. MEDICATIONS GIVEN WHOLE WITHOUT ANY DIFFICULTY. PT IS WEAK, AND HAS REFUSED BILIARY STENT PLACEMENT TODAY AND WOULD LIKE TO GO HOME TODAY ON HOSPICE. FAMILY AT BEDSIDE

## 2014-10-15 NOTE — Telephone Encounter (Signed)
-----   Message from [name], MD sent at 10/15/2014  2:09 PM EDT ----- Regarding: Home with Hospice Please call patient, let her know we are sorry to hear she is doing so poorly, we're thinking of her, can I come make a home visit with her in the next few days? We are here for her.

## 2014-10-15 NOTE — Discharge Summary (Signed)
[facility] NAME: Tiffany Gamble    MR#:  [id number]  DATE OF BIRTH:  [birth year]  DATE OF ADMISSION:  10/13/2014 ADMITTING PHYSICIAN: Dustin Flock, MD  DATE OF DISCHARGE: No discharge date for patient encounter.  PRIMARY CARE PHYSICIAN: Enid Derry, MD    ADMISSION DIAGNOSIS:  Hypokalemia [E87.6] Abdominal distention [R14.0] Acute pulmonary edema [J81.0] Jaundice [R17] Ascites [R18.8]  DISCHARGE DIAGNOSIS:  Active Problems:   Ascites Pancreatic  Cancer abdominal distension Acute resp distress with hypoxia ANASARCA  Hypotension  SECONDARY DIAGNOSIS:   Past Medical History  Diagnosis Date  . Lupus   . Staph infection     MRSA  . Arthritis   . Cancer 2016    pancreas  . Ovarian cancer 2011    left  . Pancreatic cancer   . COPD (chronic obstructive pulmonary disease)     HOSPITAL COURSE:   Brief history and physical  Please review history and physical for complete details. Tiffany Gamble is a 69 y.o. female with a known history of recent diagnosis of pancreatic cancer who is currently on chemotherapy with history of gastric bypass, who was recently hospitalized and discharged on 5/28, with complaint of syncope. During that hospitalization she was noted to have obstructive jaundice ERCP attempted however was unsuccessful due to her bypass surgery. Patient had a ascites drained by IR placed. Plan was for her to have a stent placement by IR as outpatient this coming Wednesday. Patient reports that she started having shortness of breath and significant swelling involving her lower extremities and her abdomen over the past few days. She does not have any chest pains no fevers or chills. Denies any nausea vomiting or diarrhea.  Hospital course  1. Acute respiratory distress with hypoxia and swelling of her lower extremities/anasarca: Due to her pancreatic cancer, low albumen state possible diastolic congestive  heart failure. Will give Lasix cautiously as the patient is hypotensive at this time. Echocardiogram with normal ejection fraction 55-60%. I don't think patient does have diastolic congestive heart failure but the fluid overload is from the underlying pancreatic cancer with anasarca  2. Atrial fibrillation: New onset, patient not a candidate for anticoagulation, discontinued Cardizem secondary to hypotension . Cardiology consult is placed to Dr. Nehemiah Massed. Currently rate controlled. ReplaceD her electrolytes  3 . Severe hypokalemia: Replace her potassium orally and IV when necessary  4. COPD: Continue nebulizers, Symbicort and Spiriva as doing at home  5. Pancreatic cancer: Likely progressive, palliative care consult was placed. Patient's CODE STATUS was changed to DO NOT RESUSCITATE and eventually to comfort care measures today. Patient's clinical situation is gradually getting worse 6. Nicotine addiction smoking cessation provided to the patient for minutes spent nicotine replacement will be offered strongly recommended she stop smoking.,   7. Hypotension-holding blood pressure medications. 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 Will continue pain management with fentanyl patch and oxycodone. Pan cultures are pending   8. 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  9. Poor prognosis. Palliative care is consulted as the patient's clinical situation is deteriorating from the underlying pancreatic cancer. After discussing with the patient and family members her CODE STATUS has been changed to DO NOT RESUSCITATE with comfort care. At this point of time we have canceled her inpatient. The stent placement and discharging the patient  home with hospice care  DISCHARGE CONDITIONS:  Guarded   CONSULTS OBTAINED:  Treatment Team:  Lloyd Huger,  MD Isaias Cowman, MD  palliative care  PROCEDURES NONE   DRUG ALLERGIES:   Allergies  Allergen Reactions  . Acetaminophen Other (See Comments)    Reaction: Affects liver functioning  . Clotrimazole Other (See Comments)    Reaction: Unknown  . Codeine Hives and Other (See Comments)    Reaction: Weal  . Fluticasone Furoate-Vilanterol Other (See Comments)    Reaction: Thrush  . Hydrocodone-Acetaminophen Itching and Other (See Comments)    Reaction: Weal  . Ibuprofen Other (See Comments)    Reaction: Affects liver functioning  . Prednisone Swelling and Other (See Comments)    Reaction: Throat swelling  . Penicillin G Rash  . Penicillins Rash    DISCHARGE MEDICATIONS:   Current Discharge Medication List    START taking these medications   Details  morphine 20 MG/5ML solution 5 mg - 10 mg Qty: 100 mL, Refills: 0      CONTINUE these medications which have CHANGED   Details  fentaNYL (DURAGESIC - DOSED MCG/HR) 75 MCG/HR Place 1 patch (75 mcg total) onto the skin every 3 (three) days. Qty: 5 patch, Refills: 0    prochlorperazine (COMPAZINE) 10 MG tablet Take 1 tablet (10 mg total) by mouth every 8 (eight) hours as needed for nausea or vomiting. Qty: 30 tablet, Refills: 0      CONTINUE these medications which have NOT CHANGED   Details  albuterol (PROVENTIL HFA;VENTOLIN HFA) 108 (90 BASE) MCG/ACT inhaler Inhale 2 puffs into the lungs every 6 (six) hours as needed for wheezing or shortness of breath.    budesonide-formoterol (SYMBICORT) 160-4.5 MCG/ACT inhaler Inhale 2 puffs into the lungs 2 (two) times daily. Qty: 1 Inhaler, Refills: 2    fluticasone (FLONASE) 50 MCG/ACT nasal spray Place 2 sprays into both nostrils daily.    polyethylene glycol (MIRALAX / GLYCOLAX) packet Take 17 g by mouth 2 (two) times daily as needed for mild constipation.     potassium chloride (KLOR-CON) 20 MEQ packet Take 40 mEq by mouth 2 (two) times daily. Qty: 50 packet, Refills: 0     tiotropium (SPIRIVA) 18 MCG inhalation capsule Place 18 mcg into inhaler and inhale daily.    fentaNYL (DURAGESIC - DOSED MCG/HR) 50 MCG/HR Place 1 patch (50 mcg total) onto the skin every 3 (three) days. Qty: 10 patch, Refills: 0    lidocaine-prilocaine (EMLA) cream Apply 1 application topically as needed. Apply to port then cover with saran wrap 1-2 hours prior to chemotherapy Qty: 30 g, Refills: 1      STOP taking these medications     cyclobenzaprine (FLEXERIL) 10 MG tablet      furosemide (LASIX) 40 MG tablet      Multiple Vitamins-Minerals (MULTIVITAMIN ADULT PO)      Oxycodone HCl 20 MG TABS      pantoprazole (PROTONIX) 40 MG tablet      ursodiol (ACTIGALL) 300 MG capsule          DISCHARGE INSTRUCTIONS:    continue pain management as needed basis. Continue home oxygen via nasal cannula   DIET:  As tolerated   DISCHARGE CONDITION:  Guarded with poor prognosis   ACTIVITY:  Activity as tolerated  OXYGEN:  Home Oxygen: Yes.     Oxygen Delivery: Nasal cannula-4 L  DISCHARGE LOCATION:  home  with hospice care   If you experience worsening of your admission symptoms, develop shortness of breath,  life threatening emergency, suicidal or homicidal thoughts you must seek medical attention immediately by calling 911 or calling your MD immediately  if symptoms less severe.  You Must read complete instructions/literature along with all the possible adverse reactions/side effects for all the Medicines you take and that have been prescribed to you. Take any new Medicines after you have completely understood and accpet all the possible adverse reactions/side effects.   Please note  You were cared for by a hospitalist during your hospital stay. If you have any questions about your discharge medications or the care you received while you were in the hospital after you are discharged, you can call the unit and asked to speak with the hospitalist on call if the hospitalist  that took care of you is not available. Once you are discharged, your primary care physician will handle any further medical issues. Please note that NO REFILLS for any discharge medications will be authorized once you are discharged, as it is imperative that you return to your primary care physician (or establish a relationship with a primary care physician if you do not have one) for your aftercare needs so that they can reassess your need for medications and monitor your lab values.     Today  Chief Complaint  Patient presents with  . Shortness of Breath    x 2 days    patient is feeling uncomfortable. Complaining of pain. Abdomen abdomen is distended .reporting shortness of breath with minimal exertion, unable to speak in full sentences   ROS:  CONSTITUTIONAL: Denies fevers, chills.  reporting fatigue, weakness.  EYES: Denies blurry vision, double vision, eye pain. EARS, NOSE, THROAT: Denies tinnitus, ear pain, hearing loss. RESPIRATORY: Denies cough, wheeze, but reports  shortness of breath with minimal exertion .  CARDIOVASCULAR: Denies chest pain, palpitations, edema.  GASTROINTESTINAL: Has abdominal distention with discomfort and  abdominal pain. Denies bright red blood per rectum. GENITOURINARY: Denies dysuria, hematuria. ENDOCRINE: Denies nocturia or thyroid problems. HEMATOLOGIC AND LYMPHATIC: Denies easy bruising or bleeding. SKIN: Denies rash or lesion. MUSCULOSKELETAL: Denies pain in neck, back, shoulder, knees, hips or arthritic symptoms.  NEUROLOGIC: Denies paralysis, paresthesias.  PSYCHIATRIC: Denies anxiety or depressive symptoms.   VITAL SIGNS:  Blood pressure 97/69, pulse 114, temperature 98.1 F (36.7 C), temperature source Oral, resp. rate 16, height 5\' 1"  (1.549 m), weight 104.4 kg (230 lb 2.6 oz), SpO2 93 %.  I/O:   Intake/Output Summary (Last 24 hours) at 10/15/14 1219 Last data filed at 10/15/14 1100  Gross per 24 hour  Intake    124 ml  Output    900  ml  Net   -776 ml    PHYSICAL EXAMINATION:  GENERAL:  69 y.o.-year-old patient lying in the bed with no acute distress.  generalized edema  EYES: Pupils equal, round, reactive to light and accommodation. No scleral icterus. Extraocular muscles intact.  HEENT: Head atraumatic, normocephalic. Oropharynx and nasopharynx clear.  NECK:  Supple, no jugular venous distention. No thyroid enlargement, no tenderness.  LUNGS: Moderate air entry with decreased breath sounds at the lower lung lobes, positive rhonchi or crepitation.  use of accessory muscles of respiration. CARDIOVASCULAR: S1, S2 normal. No murmurs, rubs, or gallops.  ABDOMEN: -tender, -distended. Bowel sounds present.  EXTREMITIES: No pedal edema, cyanosis, or clubbing.  NEUROLOGIC:  Sensation intact. Gait not checked.  PSYCHIATRIC: The patient is alert and oriented x 1-2  SKIN: No obvious rash, lesion, or ulcer.   DATA REVIEW:   CBC  Recent  Labs Lab 10/15/14 0518  WBC 17.1*  HGB 11.5*  HCT 34.4*  PLT 232    Chemistries   Recent Labs Lab 10/15/14 0518  NA 125*  K 4.0  CL 94*  CO2 20*  GLUCOSE 107*  BUN 26*  CREATININE 1.56*  CALCIUM 7.9*  MG 1.7  AST 27  ALT 22  ALKPHOS 227*  BILITOT 3.4*    Cardiac Enzymes  Recent Labs Lab 10/13/14 1135  TROPONINI <0.03    Microbiology Results  Results for orders placed or performed during the hospital encounter of 10/13/14  Culture, blood (routine x 2)     Status: None (Preliminary result)   Collection Time: 10/14/14  3:08 PM  Result Value Ref Range Status   Specimen Description BLOOD  Final   Special Requests BLOOD  Final   Culture NO GROWTH < 24 HOURS  Final   Report Status PENDING  Incomplete  Culture, blood (routine x 2)     Status: None (Preliminary result)   Collection Time: 10/14/14  3:10 PM  Result Value Ref Range Status   Specimen Description BLOOD  Final   Special Requests BLOOD  Final   Culture NO GROWTH < 24 HOURS  Final   Report Status  PENDING  Incomplete    RADIOLOGY:  Dg Chest 1 View  10/14/2014   CLINICAL DATA:  PICC line placement.  EXAM: CHEST  1 VIEW  COMPARISON:  10/13/2014  FINDINGS: A right-sided Port-A-Cath terminates over the low SVC or cavoatrial junction. Placement of a right-sided PICC line which terminates at the mid to low SVC.  Midline trachea. Normal heart size. No pleural effusion or pneumothorax. Mildly low lung volumes with minimal left base subsegmental atelectasis.  IMPRESSION: Right sided PICC line, terminating at the mid to low SVC, without pneumothorax.   Electronically Signed   By: Abigail Miyamoto M.D.   On: 10/14/2014 18:44   Dg Chest 2 View  10/13/2014   CLINICAL DATA:  Shortness of breath. History of pancreatic carcinoma.  EXAM: CHEST - 2 VIEW  COMPARISON:  07/22/2014  FINDINGS: Stable positioning of Port-A-Cath. Bibasilar atelectasis present, right greater than left. Mild interstitial prominence present in both lungs which appears slightly more prominent. This may be consistent with minimal interstitial edema and is not associated with alveolar edema or pleural fluid. The heart size and mediastinal contours are normal. No bony abnormalities are seen.  IMPRESSION: Mild increased can bilateral pulmonary interstitial prominence. This may be consistent with mild interstitial edema.   Electronically Signed   By: Aletta Edouard M.D.   On: 10/13/2014 11:39   US Paracentesis  10/14/2014   CLINICAL DATA:  Ascites  EXAM: ULTRASOUND GUIDED PARACENTESIS  TECHNIQUE: Survey ultrasound of the abdomen was performed and an appropriate skin entry site in the right lower abdomen was selected. Skin site was marked, prepped with Betadine, and draped in usual sterile fashion, and infiltrated locally with 1% lidocaine. A Safe-T-Centesis needle was advanced into the peritoneal space until fluid could be aspirated. The sheath was advanced and the needle removed. 200 cc of clear yellowascites were aspirated. The initial systolic blood  pressure was 80/6 mm Hg. After aspirating 200 cc, the blood pressure dropped to 77 mm Hg and the patient was slightly lightheaded. After placing the patient supine, she recovered clinically.  COMPLICATIONS: None  IMPRESSION: Technically successful ultrasound guided paracentesis, removing 200 cc of ascites.   Electronically Signed   By: Marybelle Killings M.D.   On: 10/14/2014 11:27  EKG:   Orders placed or performed during the hospital encounter of 10/13/14  . ED EKG  . ED EKG  . EKG 12-Lead  . EKG 12-Lead      Management plans discussed with the patient, family and they are in agreement.  CODE STATUS:     Code Status Orders        Start     Ordered   10/15/14 [phone]  Do not attempt resuscitation (DNR)   Continuous    Question Answer Comment  In the event of cardiac or respiratory ARREST Do not call a "code blue"   In the event of cardiac or respiratory ARREST Do not perform Intubation, CPR, defibrillation or ACLS   In the event of cardiac or respiratory ARREST Use medication by any route, position, wound care, and other measures to relive pain and suffering. May use oxygen, suction and manual treatment of airway obstruction as needed for comfort.      10/15/14 0834    Advance Directive Documentation        Most Recent Value   Type of Advance Directive  Healthcare Power of Attorney, Living will   Pre-existing out of facility DNR order (yellow form or pink MOST form)     "MOST" Form in Place?        TOTAL TIME TAKING CARE OF THIS PATIENT: 45 minutes.    @[facility] @  on 10/15/2014 at 12:19 PM  Between 7am to 6pm - Pager - [phone]  After 6pm go to [email or web address] - password [email or web address] [facility]  Office  [phone]  CC: Primary care physician; Enid Derry, MD

## 2014-10-15 NOTE — Telephone Encounter (Signed)
Received call from [facility] regarding who would take over prescribing pain medication since Tiffany Gamble is being discharged home with hospice. Educated that the hospice nurse would notify Dr Virgel Manifold nurse when she was in need for refills and that Dr Grayland Ormond would continue to prescribe these for her.

## 2014-10-15 NOTE — Progress Notes (Signed)
   10/15/14 1101  Clinical Encounter Type  Visited With Patient and family together  Visit Type Follow-up  Consult/Referral To Chaplain  Spiritual Encounters  Spiritual Needs Emotional  Stress Factors  Patient Stress Factors Health changes  Family Stress Factors Health changes  Chaplain rounded in unit and went in to visit with patient and family. Provided a compassionate presence. Chaplain Keyston Ardolino A. [place] Ext. [phone]

## 2014-10-15 NOTE — Clinical Documentation Improvement (Signed)
10/13/14 ED Note..."presents today with increasing shortness of breath. She states that she is "swelling" and that she has "too much fluid". She has been tasting Lasix at home,..."Acute pulmonary edema"... 10/14/14 Progr Note..."1. Shortness of breath and swelling of her lower extremities/anasarca: Due to her pancreatic cancer, low albumen state possible diastolic congestive heart failure. Will give Lasix cautiously as the patient is hypotensive at this time.".Marland Kitchen. 10/13/14: Echo report: LV EF: 55% -  65%   Ref. Range 10/13/2014 11:38  B Natriuretic Peptide Latest Ref Range: 0.0-100.0 pg/mL 70.0  For accurate Dx specificity & severity can noted "possible diastolic congestive heart failure" be further specified w/ acuity if possible. Thank you  . Document acuity --Acute --Chronic --Acute on Chronic . Document type --Diastolic --Systolic --Combined systolic and diastolic . Due to or associated with--Cardiac or other surgery--Hypertension--Valvular disease--Rheumatic heart disease --Other (specify) --Unable to determine  Supporting Information: See above note   Thank You,  Ermelinda Das, RN, BSN, CCDS Certified Clinical Documentation Specialist Pager: [place]: Health Information Management

## 2014-10-16 NOTE — Telephone Encounter (Signed)
Dr. Sanda Klein was able to see patient yesterday at the hospital.

## 2014-10-16 NOTE — Telephone Encounter (Signed)
-----   Message from [name], MD sent at 10/15/2014  2:09 PM EDT ----- Regarding: Home with Hospice Please call patient, let her know we are sorry to hear she is doing so poorly, we're thinking of her, can I come make a home visit with her in the next few days? We are here for her.

## 2014-10-17 ENCOUNTER — Other Ambulatory Visit: Payer: Self-pay | Admitting: *Deleted

## 2014-10-17 MED ORDER — MORPHINE SULFATE (CONCENTRATE) 10 MG /0.5 ML PO SOLN: ORAL | Status: AC

## 2014-10-19 LAB — CULTURE, BLOOD (ROUTINE X 2)
CULTURE: NO GROWTH
Culture: NO GROWTH

## 2014-10-20 ENCOUNTER — Other Ambulatory Visit: Payer: Self-pay

## 2014-10-20 NOTE — Telephone Encounter (Signed)
Pharm will forward request to Hospice to see if it needs to be refilled.

## 2014-10-20 NOTE — Telephone Encounter (Signed)
We don't prescribe this medicine She is at home with Hospice Have Hospice contact her GI doctor to see if this is something that can be stopped or if they think it's best to continue Thank you, Dr. Sanda Klein

## 2014-10-29 ENCOUNTER — Ambulatory Visit: Payer: Self-pay | Admitting: Family Medicine

## 2014-11-13 LAB — ACID FAST SMEAR+CULTURE W/RFLX (ARMC ONLY)
Acid Fast Culture: NEGATIVE
Acid Fast Smear: NEGATIVE

## 2015-01-19 ENCOUNTER — Encounter: Payer: Self-pay | Admitting: Family Medicine

## 2015-05-06 ENCOUNTER — Other Ambulatory Visit: Payer: Self-pay | Admitting: Nurse Practitioner

## 2016-11-21 IMAGING — MR MRI LUMBAR SPINE WITHOUT AND WITH CONTRAST
7 series · 35 of 48 positions shown · IV contrast (17ml Multihance)
Comparison: CT lumbar spine 07/10/2013 and CT abdomen and pelvis
06/10/2014.

CLINICAL DATA: Low back pain. Status post fall x2 over the past 3
weeks. Bilateral leg pain. History of pancreatic cancer.

EXAM:
MRI LUMBAR SPINE WITHOUT AND WITH CONTRAST
TECHNIQUE: Multiplanar and multiecho pulse sequences of the lumbar spine were
obtained without and with intravenous contrast.
CONTRAST:  17 cc MultiHance IV.

[Series 2: T2 · sagittal · 4.0mm · 0.81mm/px · 5 of 17 slices shown (1 of 2)]
[im 1/17]
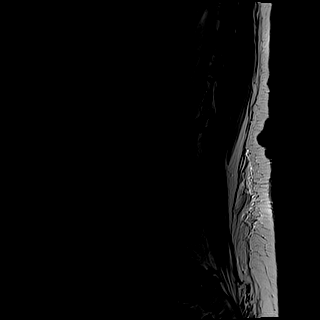
[im 5/17]
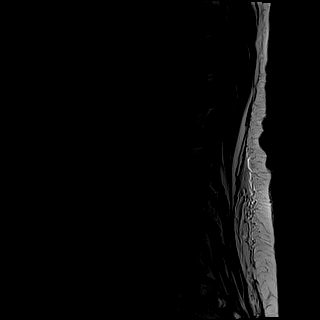
[im 9/17]
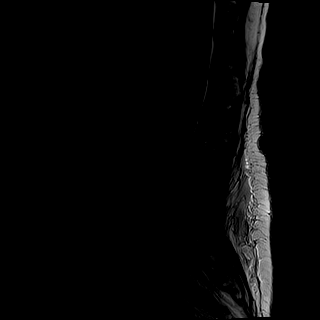
[im 13/17]
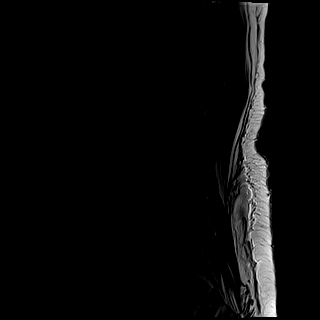
[im 17/17]
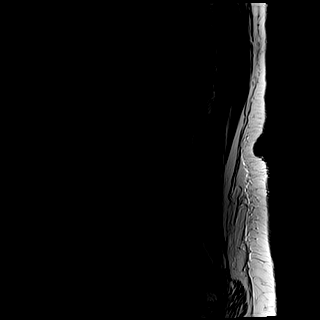

[Series 3: T1 · sagittal · 4.0mm · 0.81mm/px · 5 of 17 slices shown (1 of 2)]
[im 1/17]
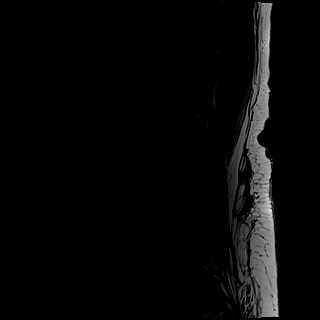
[im 5/17]
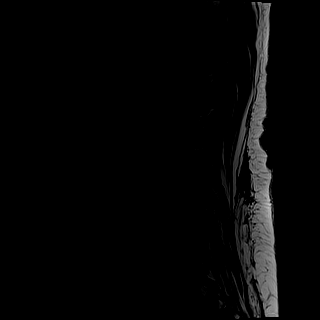
[im 9/17]
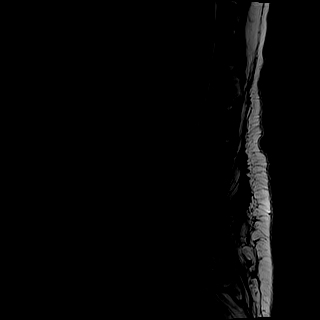
[im 13/17]
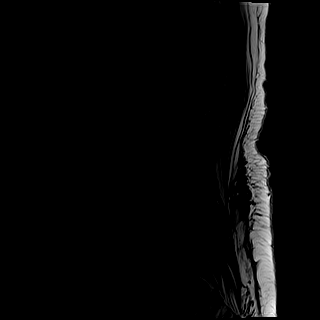
[im 17/17]
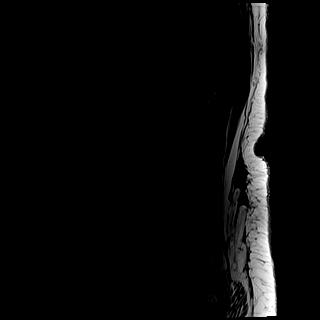

[Series 4: STIR · sagittal · 4.0mm · 1.02mm/px · 4 of 17 slices shown]
[im 1/17]
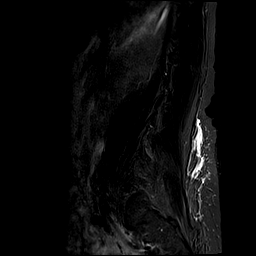
[im 6/17]
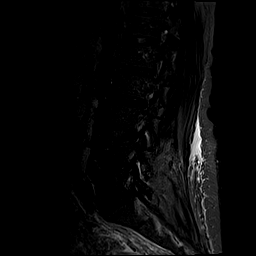
[im 11/17]
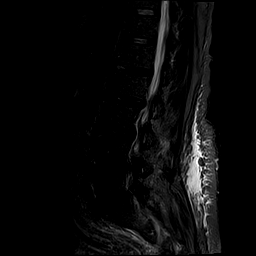
[im 17/17]
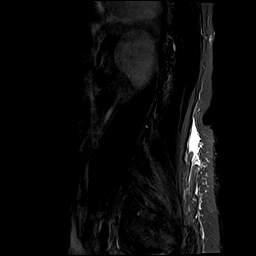

[Series 5: T2 · axial · 4.0mm · 0.78mm/px · z∈[-145,+58]mm · 8 of 41 slices shown (2 of 2)]
[im 1/41]
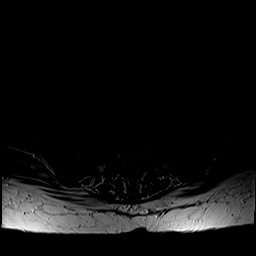
[im 5/41]
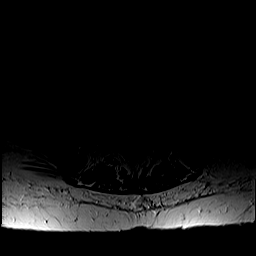
[im 14/41]
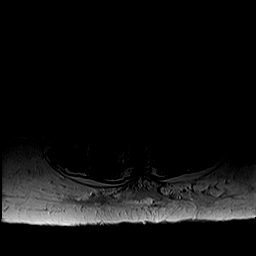
[im 18/41]
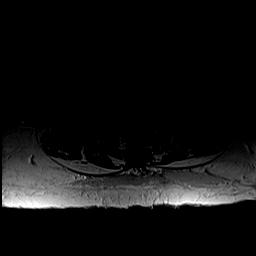
[im 23/41]
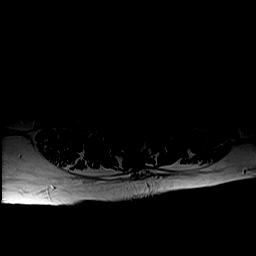
[im 27/41]
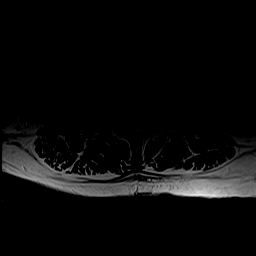
[im 36/41]
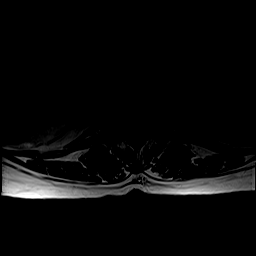
[im 41/41]
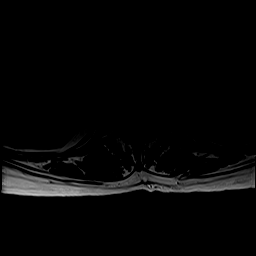

[Series 6: T1 · axial · 4.0mm · 0.39mm/px · z∈[-145,+58]mm · 8 of 41 slices shown (2 of 2)]
[im 1/41]
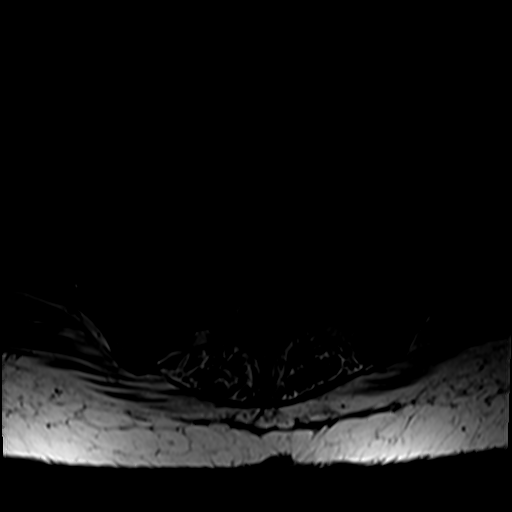
[im 5/41]
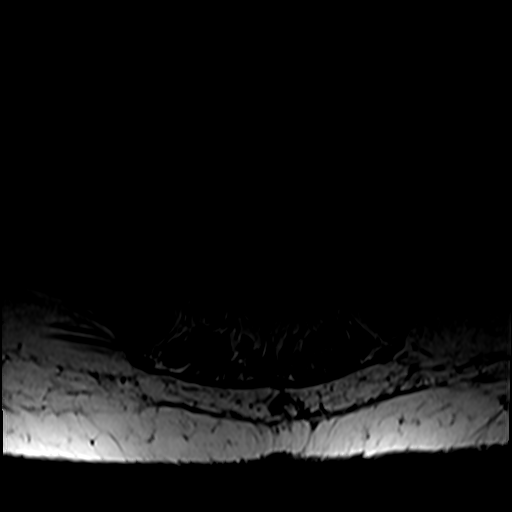
[im 14/41]
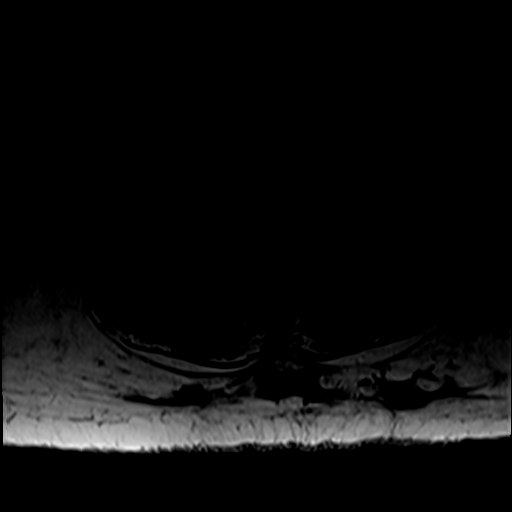
[im 18/41]
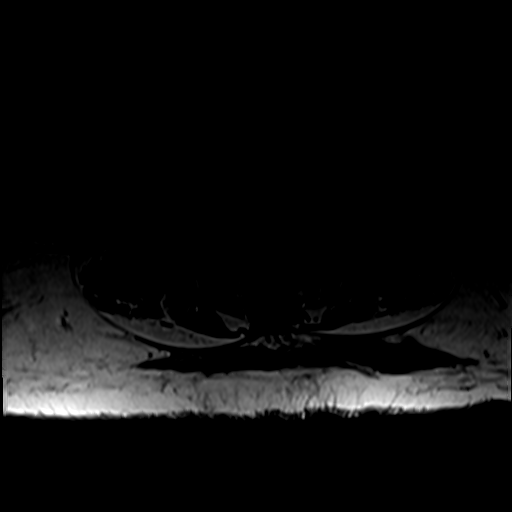
[im 23/41]
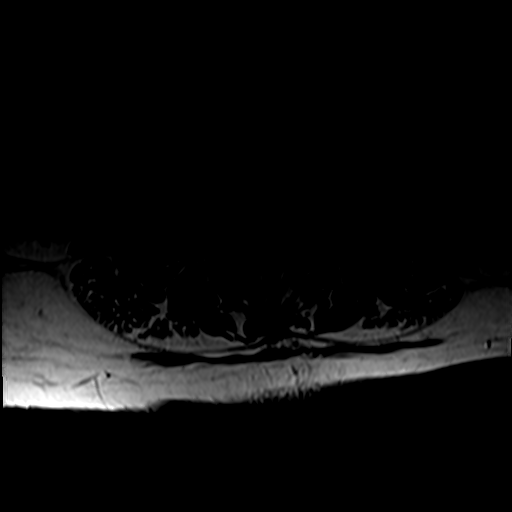
[im 27/41]
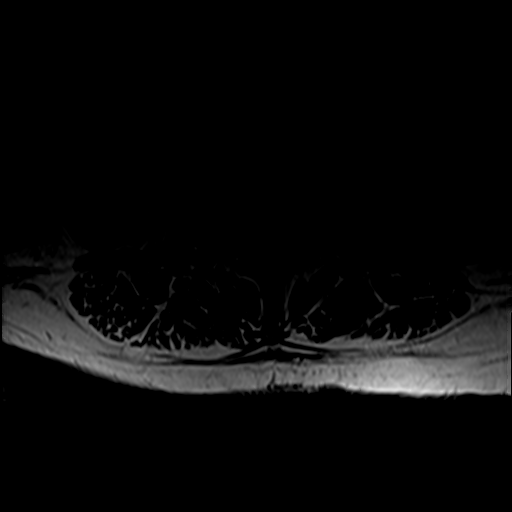
[im 36/41]
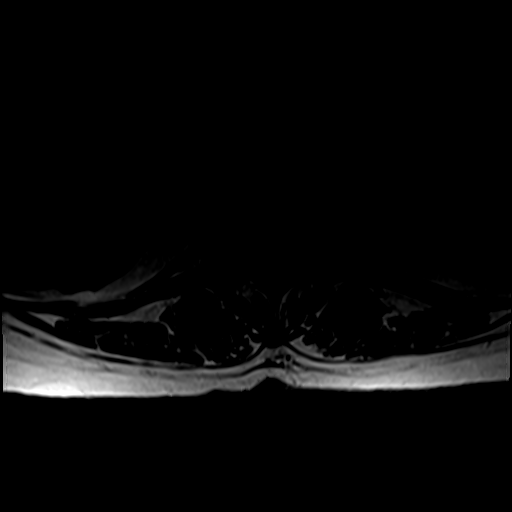
[im 41/41]
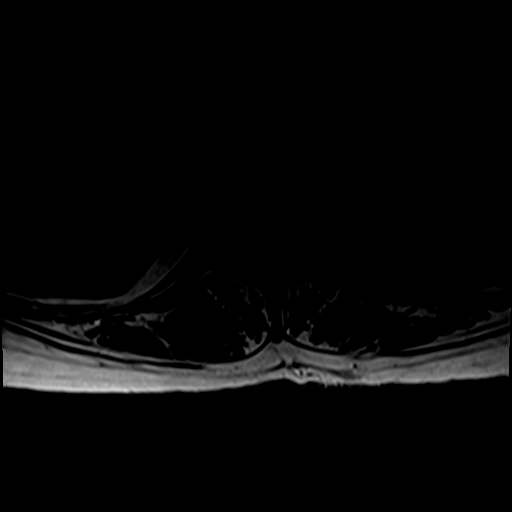

[Series 7: T1 fat-sat post-contrast · sagittal · 4.0mm · 0.81mm/px · 4 of 17 slices shown]
[im 1/17]
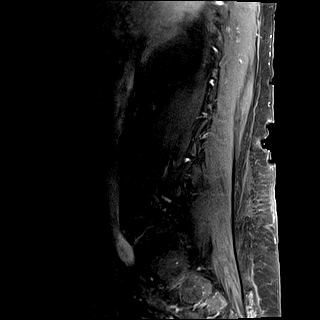
[im 6/17]
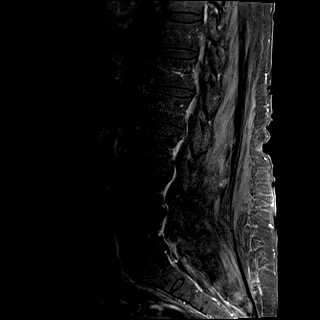
[im 11/17]
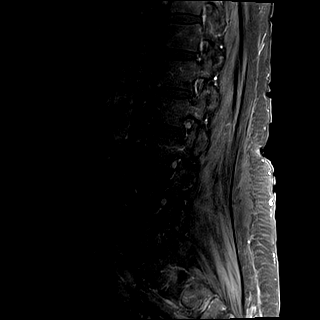
[im 17/17]
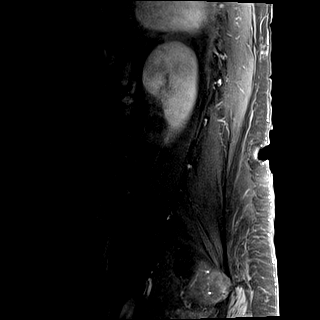

[Series 8: T1 post-contrast · axial · 4.0mm · 0.39mm/px · 1 of 41 slices shown]
[im 1/41]
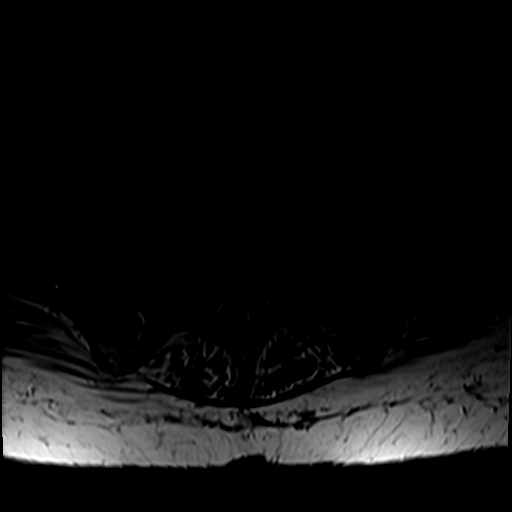

[35 of 48 positions shown; findings below may reference images not displayed]

FINDINGS: Vertebral body height and alignment are maintained. Scattered
hemangiomas are seen. There is no evidence of metastatic disease or
worrisome marrow lesion. The conus medullaris is normal in signal
and position. Imaged intra-abdominal contents are unremarkable.

The T11-12 level is imaged in the sagittal plane only. The central
canal and foramina appear widely patent.

T12-L1:  Negative.

L1-2: Small central protrusion without central canal or foraminal
narrowing.

L2-3:  Negative.

L3-4: Very shallow disc bulge without central canal or foraminal
narrowing.

L4-5: Shallow disc bulge is identified and there is some ligamentum
flavum thickening. Mild central canal narrowing is seen. There is
also mild bilateral foraminal narrowing.

L5-S1: Shallow disc bulge without central canal or foraminal
narrowing.
IMPRESSION: Spondylosis most notable at L4-5 where there is mild central canal
and foraminal narrowing.

Shallow central protrusion L1-2 without central canal or foraminal
narrowing.

Negative for metastatic disease lumbar spine.

## 2016-11-26 IMAGING — US US GUIDE NEEDLE - US PARA
1 series · 14 of 14 positions shown · non-contrast
Comparison: None.

CLINICAL DATA: Ascites and pancreatic lesion.

EXAM:
ULTRASOUND GUIDED  PARACENTESIS

[Series 1: us guide needle - us para · 0.31mm/px · 14 of 14 slices shown]
[im 1/14]
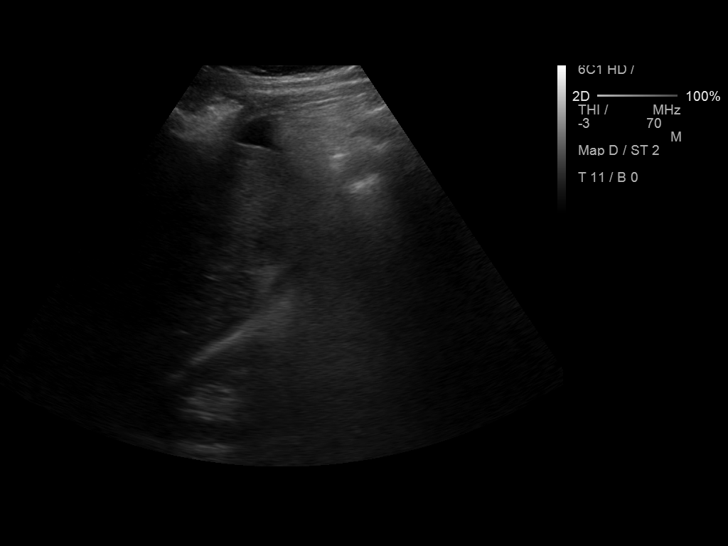
[im 2/14]
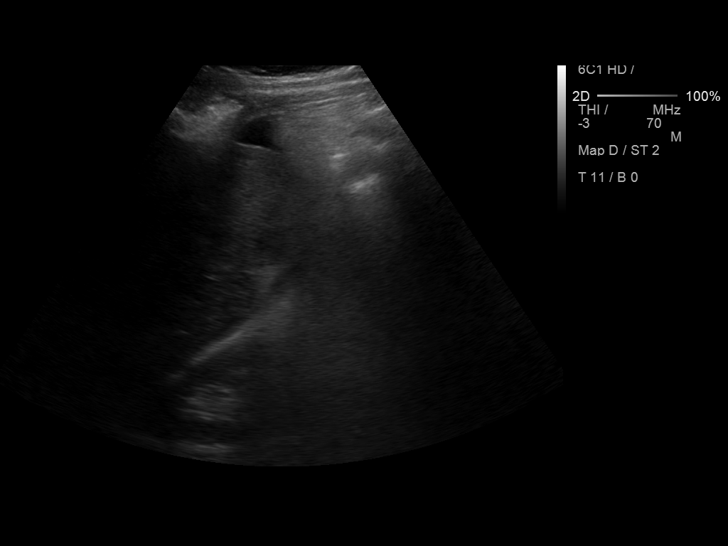
[im 3/14]
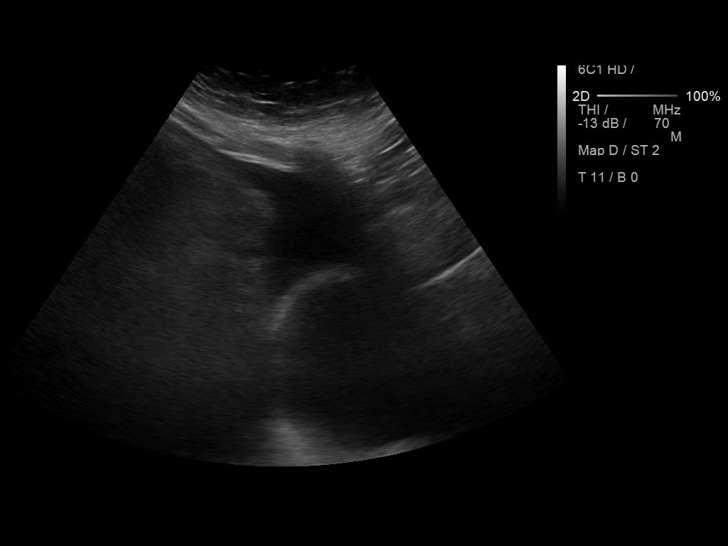
[im 4/14]
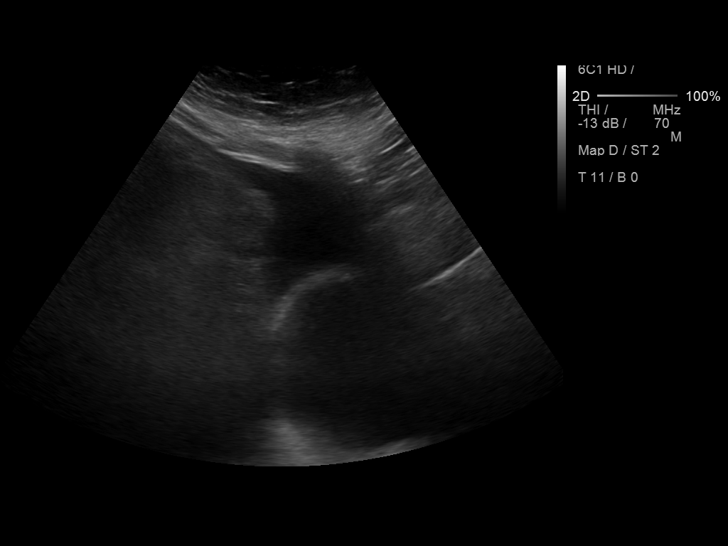
[im 5/14]
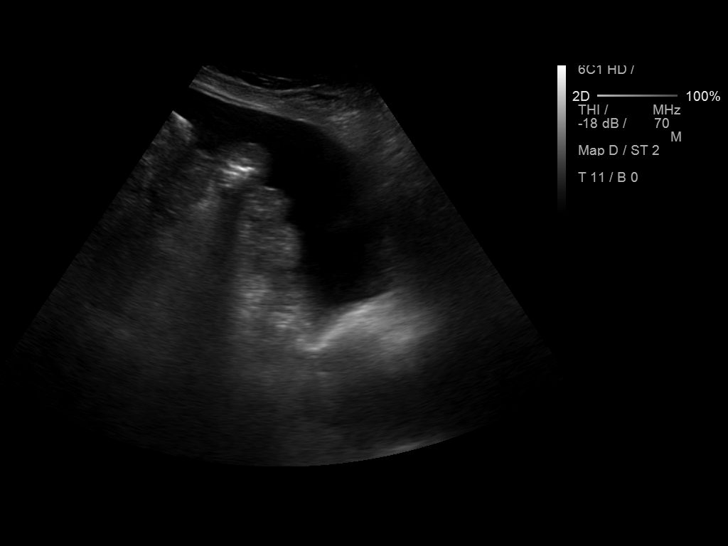
[im 6/14]
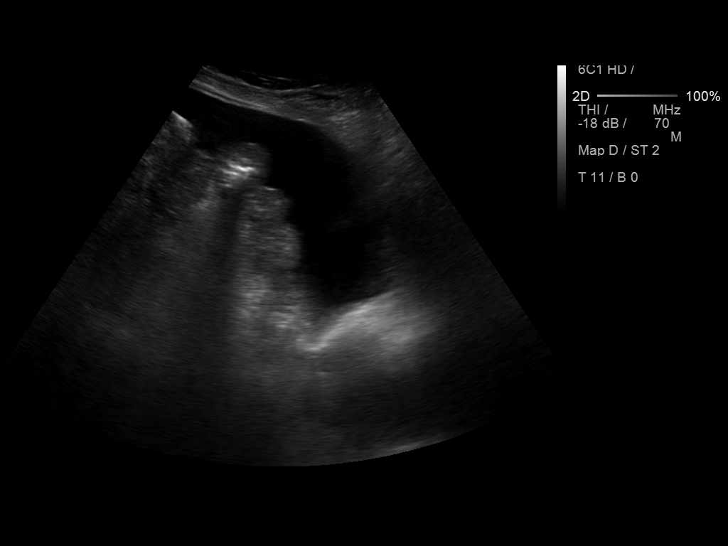
[im 7/14]
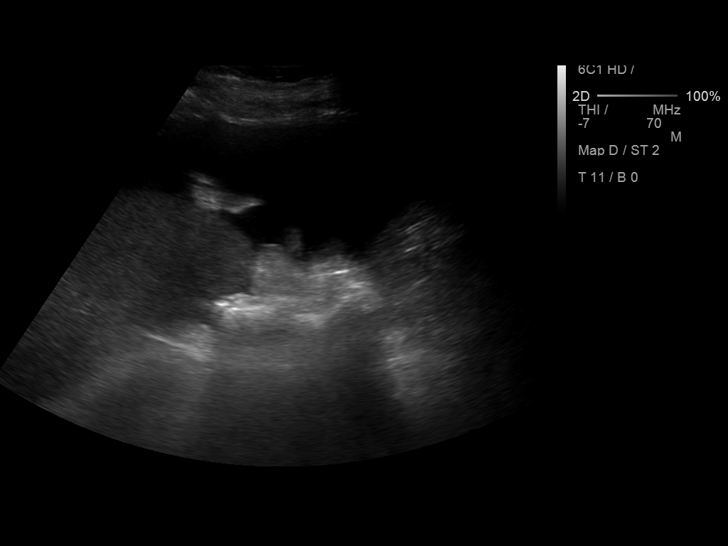
[im 8/14]
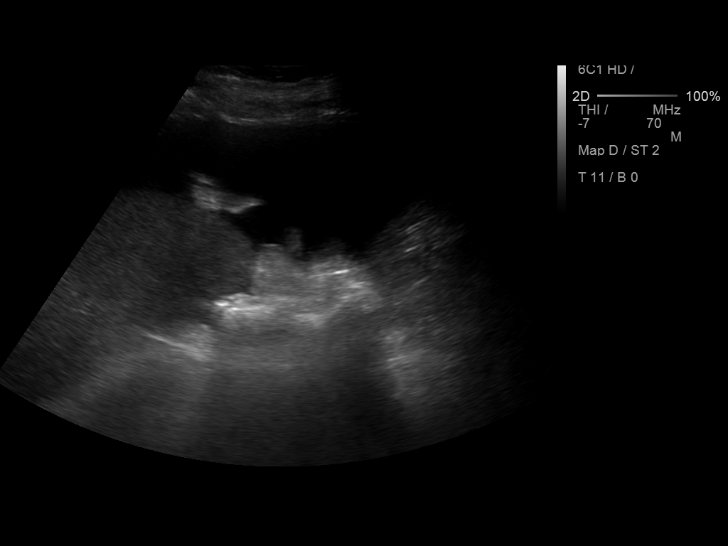
[im 9/14]
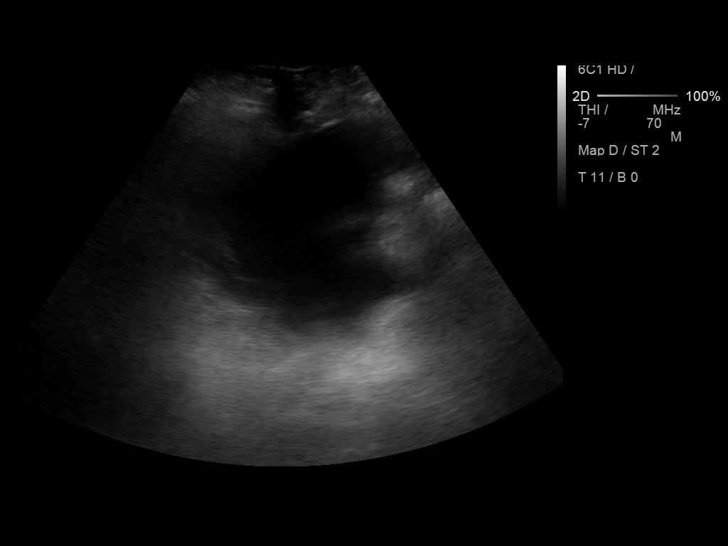
[im 10/14]
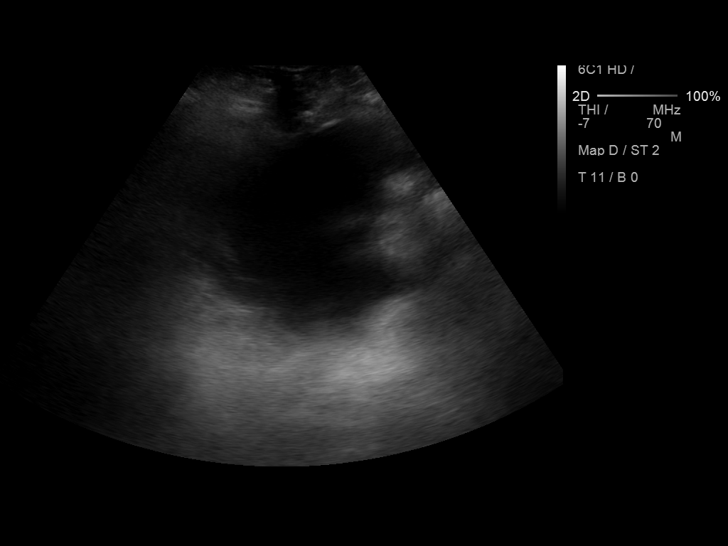
[im 11/14]
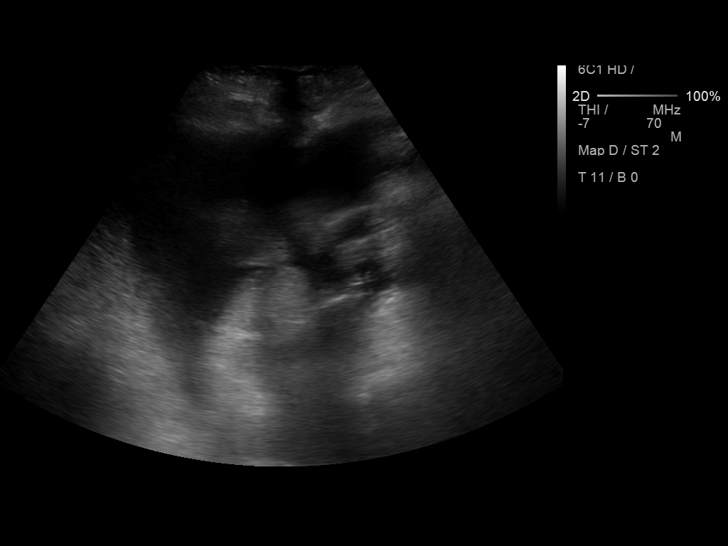
[im 12/14]
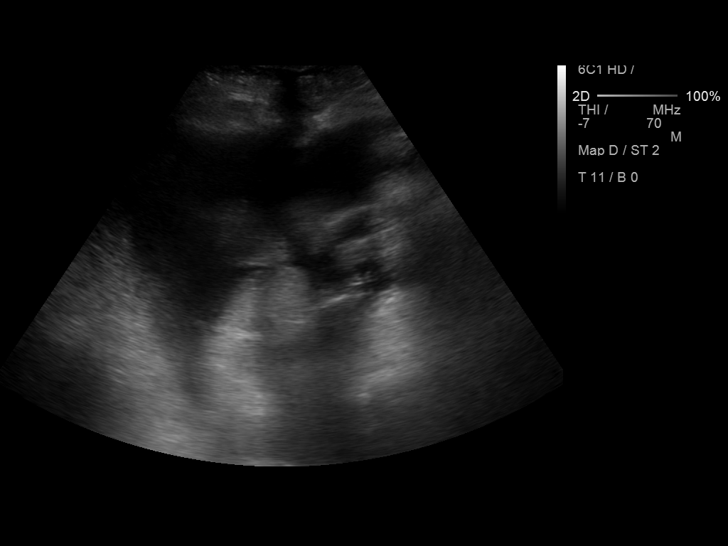
[im 13/14]
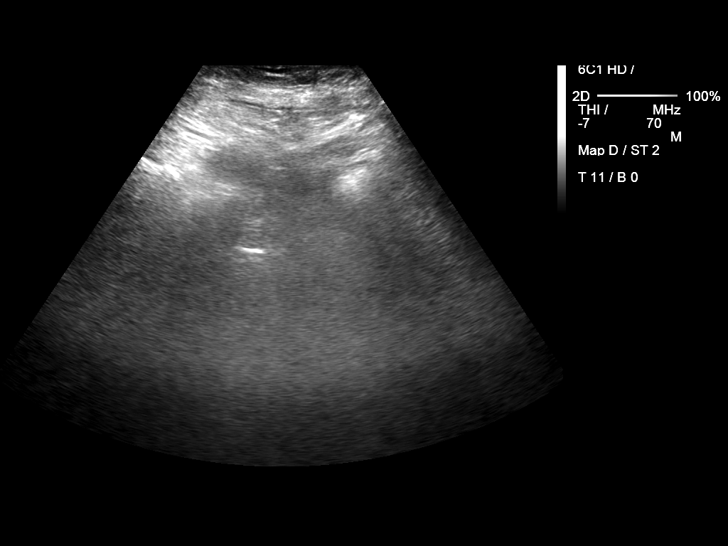
[im 14/14]
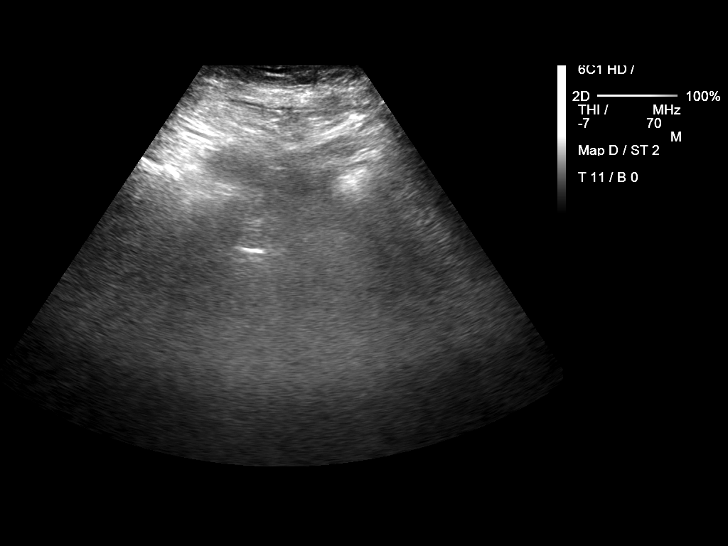

[14 of 14 positions shown; findings below may reference images not displayed]

PROCEDURE:
An ultrasound guided paracentesis was thoroughly discussed with the
patient and questions answered. The benefits, risks, alternatives
and complications were also discussed. The patient understands and
wishes to proceed with the procedure. Written consent was obtained.

Ultrasound was performed to localize and mark an adequate pocket of
fluid in the right lower quadrant of the abdomen. The area was then
prepped and draped in the normal sterile fashion. 1% Lidocaine was
used for local anesthesia. Under ultrasound guidance a
Safe-T-Centesis catheter was introduced. Paracentesis was performed.
The catheter was removed and a dressing applied.

Complications: None.
FINDINGS: A total of approximately 2 L of yellow fluid was removed. A fluid
sample was sent for laboratory analysis.

Estimated blood loss: Minimal
IMPRESSION: Successful ultrasound guided paracentesis yielding 2 L of ascites.

## 2016-12-01 IMAGING — US US ATTEMPTED PARACENTESIS
1 series · 10 of 10 positions shown · non-contrast
Comparison: 08/28/2014

CLINICAL DATA: Pancreatic carcinoma, ascites status post 2 L
paracentesis on 08/28/2014. The patient presents for additional
paracentesis, if needed.

EXAM:
LIMITED ABDOMEN ULTRASOUND FOR ASCITES
TECHNIQUE: Limited ultrasound survey for ascites was performed in all four
abdominal quadrants.

[Series 1: us attempted paracentesis · 0.28mm/px · 10 of 10 slices shown]
[im 1/10]
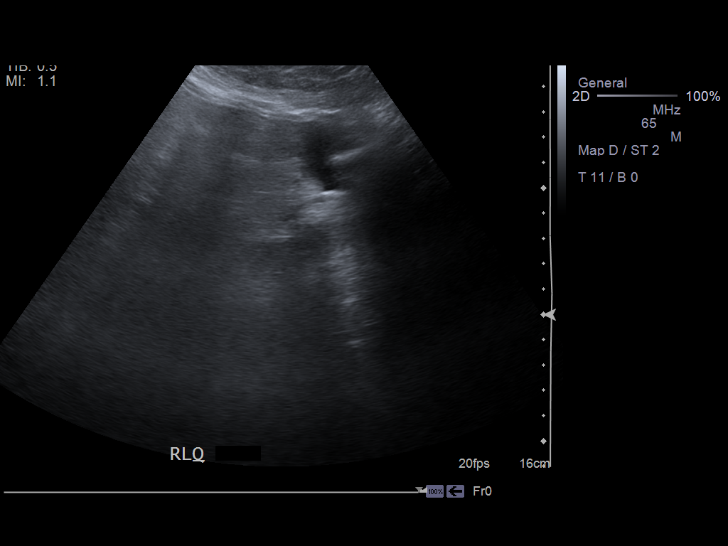
[im 2/10]
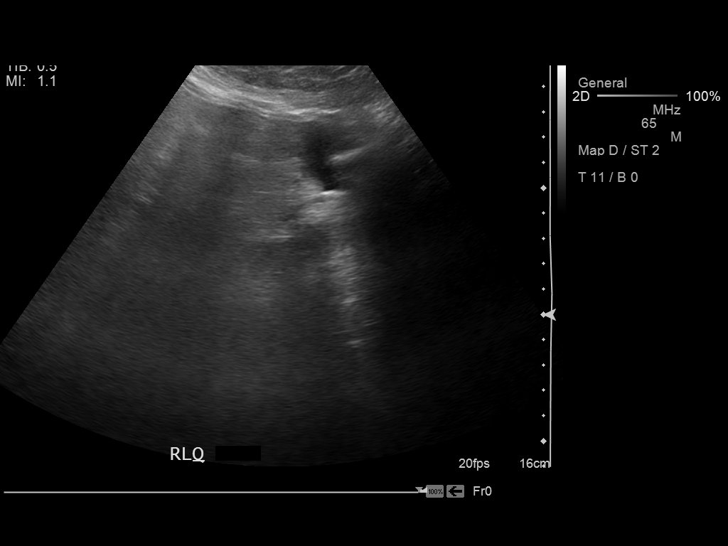
[im 3/10]
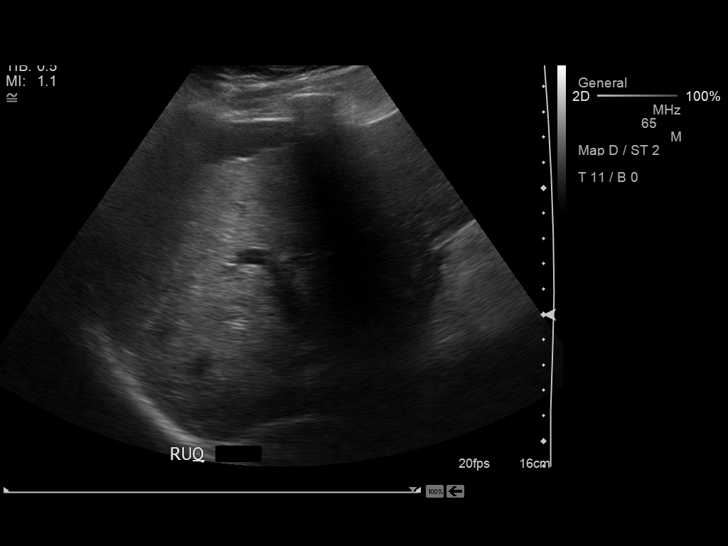
[im 4/10]
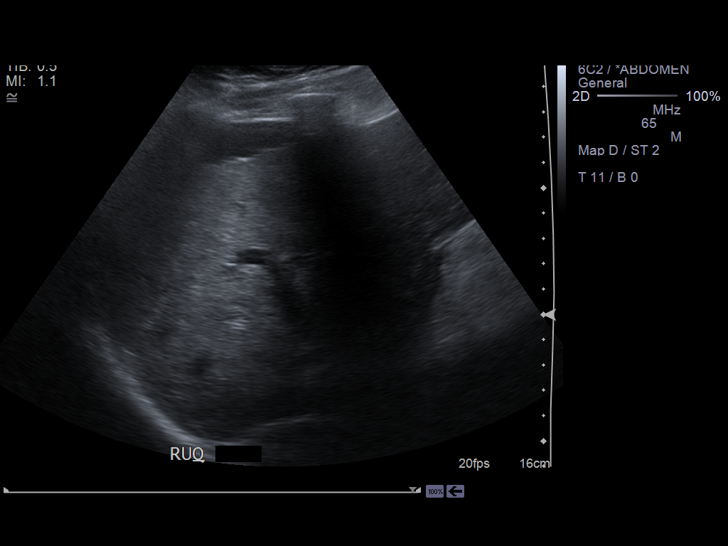
[im 5/10]
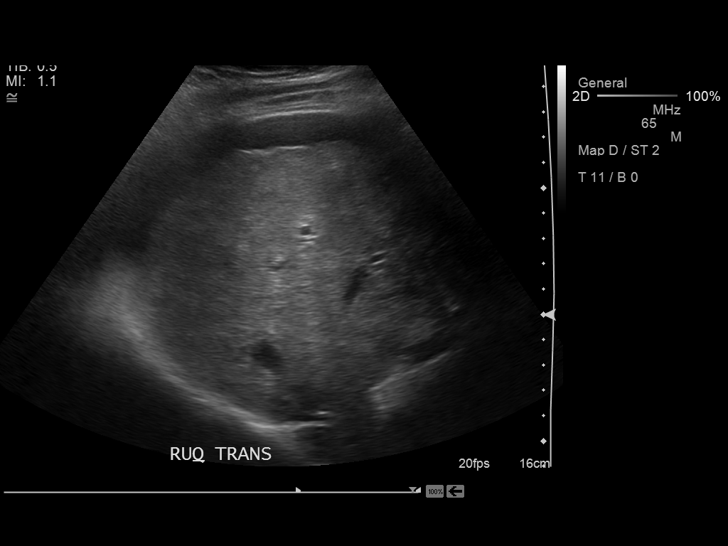
[im 6/10]
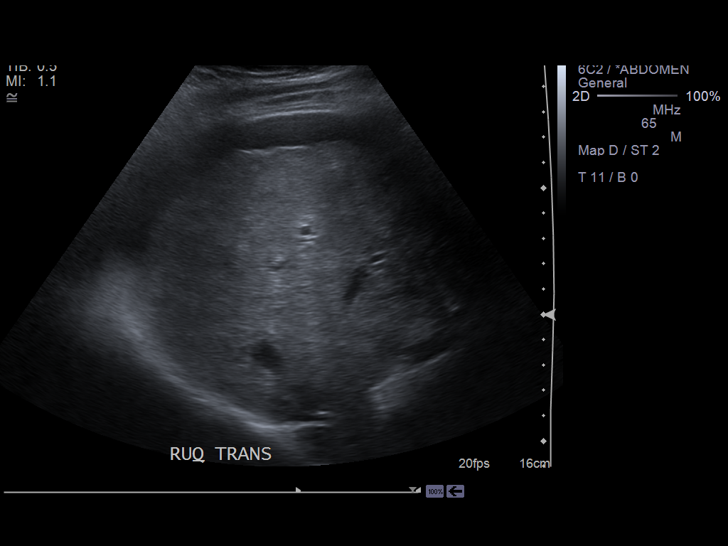
[im 7/10]
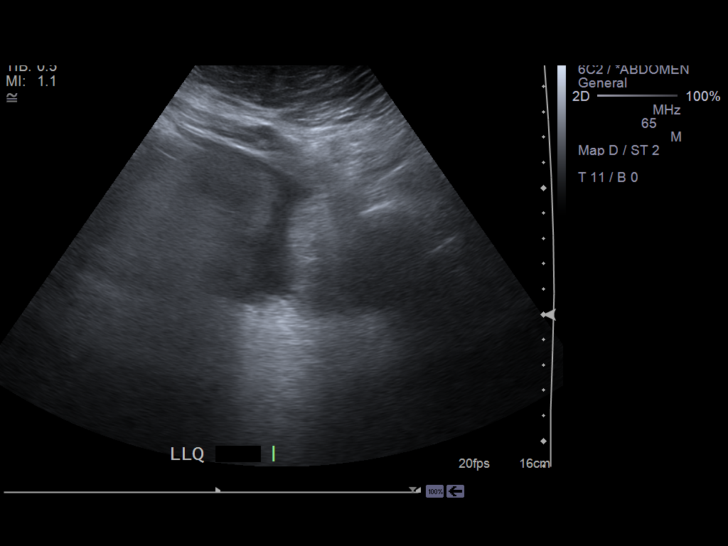
[im 8/10]
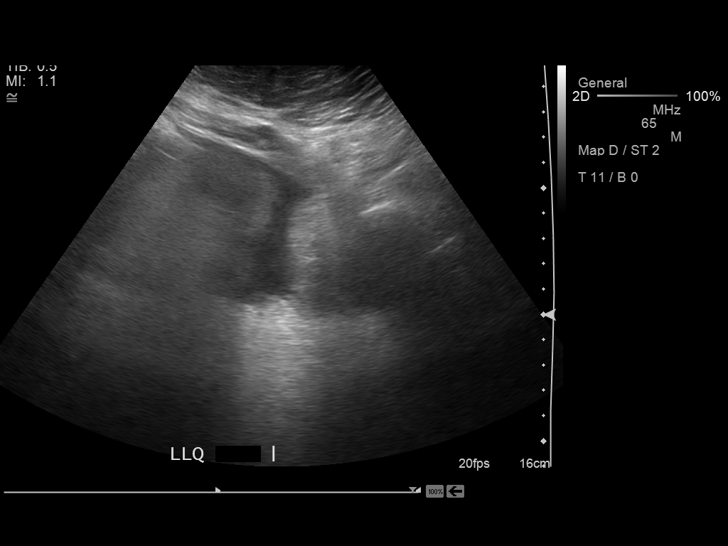
[im 9/10]
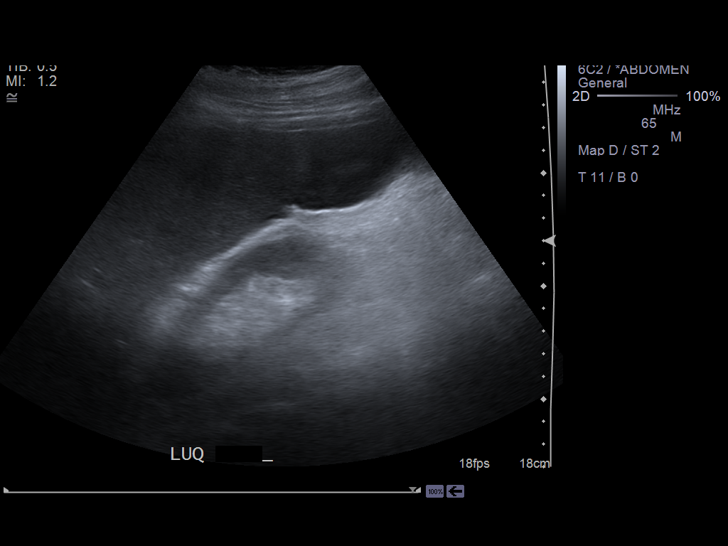
[im 10/10]
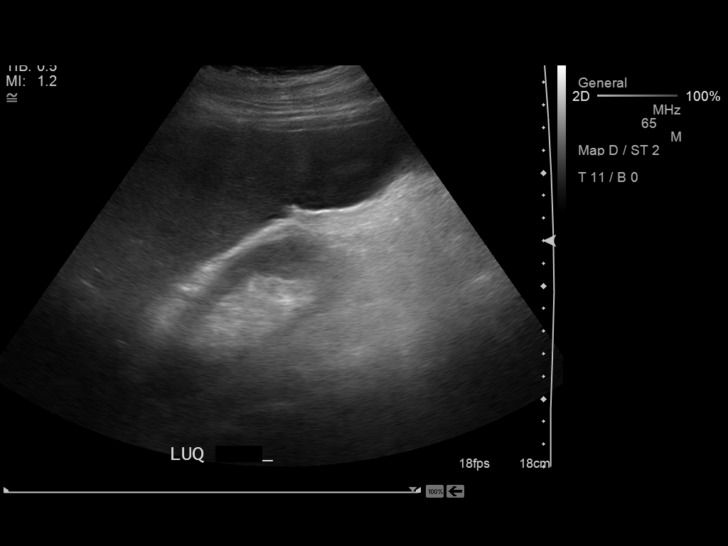

[10 of 10 positions shown; findings below may reference images not displayed]

FINDINGS: Survey of the peritoneal cavity shows a small amount of ascites
around the liver and also a small amount in the left upper quadrant
adjacent to the upper spleen. A trace amount of fluid is present in
both lower quadrants. There was not enough remaining ascites to
warrant paracentesis today.
IMPRESSION: Small amount of ascites in the peritoneal cavity. Paracentesis was
not performed today.
